# Patient Record
Sex: Male | Born: 1960 | Race: White | Hispanic: Yes | Marital: Married | Smoking: Current some day smoker
Health system: Southern US, Community
[De-identification: ages and names within clinical notes are randomized; demographics above are authoritative.]

## PROBLEM LIST (undated history)

## (undated) DIAGNOSIS — T7840XA Allergy, unspecified, initial encounter: Secondary | ICD-10-CM

## (undated) DIAGNOSIS — K219 Gastro-esophageal reflux disease without esophagitis: Secondary | ICD-10-CM

## (undated) HISTORY — DX: Gastro-esophageal reflux disease without esophagitis: K21.9

## (undated) HISTORY — DX: Allergy, unspecified, initial encounter: T78.40XA

---

## 1990-03-22 HISTORY — PX: CARPAL TUNNEL RELEASE: SHX101

## 2004-10-07 ENCOUNTER — Ambulatory Visit: Payer: Self-pay | Admitting: Family Medicine

## 2005-01-25 ENCOUNTER — Ambulatory Visit: Payer: Self-pay | Admitting: Family Medicine

## 2005-09-07 ENCOUNTER — Ambulatory Visit: Payer: Self-pay | Admitting: Family Medicine

## 2008-03-22 HISTORY — PX: COLONOSCOPY: SHX5424

## 2008-07-23 ENCOUNTER — Ambulatory Visit: Payer: Self-pay | Admitting: Gastroenterology

## 2012-04-10 ENCOUNTER — Ambulatory Visit: Payer: Self-pay | Admitting: Family Medicine

## 2012-04-10 LAB — CBC WITH DIFFERENTIAL/PLATELET
Eosinophil %: 0.8 %
Lymphocyte %: 33.4 %
MCH: 28.6 pg (ref 26.0–34.0)
MCHC: 32.9 g/dL (ref 32.0–36.0)
Monocyte #: 0.6 x10 3/mm (ref 0.2–1.0)
Monocyte %: 6.7 %
Neutrophil #: 5.3 10*3/uL (ref 1.4–6.5)
Neutrophil %: 58.6 %
Platelet: 227 10*3/uL (ref 150–440)
RDW: 12.9 % (ref 11.5–14.5)
WBC: 9.1 10*3/uL (ref 3.8–10.6)

## 2014-05-16 ENCOUNTER — Emergency Department: Payer: Self-pay | Admitting: Internal Medicine

## 2014-07-18 DIAGNOSIS — N132 Hydronephrosis with renal and ureteral calculous obstruction: Secondary | ICD-10-CM | POA: Insufficient documentation

## 2014-07-21 HISTORY — PX: LITHOTRIPSY: SUR834

## 2014-12-10 ENCOUNTER — Encounter: Payer: Self-pay | Admitting: Family Medicine

## 2014-12-10 ENCOUNTER — Ambulatory Visit (INDEPENDENT_AMBULATORY_CARE_PROVIDER_SITE_OTHER): Payer: BLUE CROSS/BLUE SHIELD | Admitting: Family Medicine

## 2014-12-10 VITALS — BP 130/78 | HR 80 | Ht 67.0 in | Wt 206.0 lb

## 2014-12-10 DIAGNOSIS — J0100 Acute maxillary sinusitis, unspecified: Secondary | ICD-10-CM

## 2014-12-10 MED ORDER — AMOXICILLIN-POT CLAVULANATE 875-125 MG PO TABS: 1.0000 | ORAL_TABLET | Freq: Two times a day (BID) | ORAL | Status: DC

## 2014-12-10 NOTE — Progress Notes (Signed)
Name: Blake Phelps   MRN: [id number]    DOB: Mar 01, 1961   Date:12/10/2014       Progress Note  Subjective  Chief Complaint  Chief Complaint  Patient presents with  . Sinusitis    ears feel full and coughing- no production    Sinusitis This is a new problem. The current episode started in the past 7 days. The problem has been gradually worsening since onset. There has been no fever. Associated symptoms include congestion, coughing, a hoarse voice and a sore throat. Pertinent negatives include no chills, diaphoresis, ear pain, headaches, neck pain, shortness of breath or sinus pressure. Past treatments include acetaminophen. The treatment provided moderate relief.    No problem-specific assessment & plan notes found for this encounter.   Past Medical History  Diagnosis Date  . GERD (gastroesophageal reflux disease)   . Allergy     Past Surgical History  Procedure Laterality Date  . Lithotripsy  07/2014  . Carpal tunnel release  1992  . Colonoscopy  2010    cleared for 10 yrs- Dr Bluford Kaufmann    Family History  Problem Relation Age of Onset  . Cancer Mother   . Heart disease Mother   . Cancer Father     Social History   Social History  . Marital Status: Married    Spouse Name: N/A  . Number of Children: N/A  . Years of Education: N/A   Occupational History  . Not on file.   Social History Main Topics  . Smoking status: Current Every Day Smoker  . Smokeless tobacco: Not on file  . Alcohol Use: No  . Drug Use: No  . Sexual Activity: No   Other Topics Concern  . Not on file   Social History Narrative  . No narrative on file    No Known Allergies   Review of Systems  Constitutional: Negative for fever, chills, weight loss, malaise/fatigue and diaphoresis.  HENT: Positive for congestion, hoarse voice and sore throat. Negative for ear discharge, ear pain and sinus pressure.   Eyes: Negative for blurred vision.  Respiratory: Positive for cough. Negative for  sputum production, shortness of breath and wheezing.   Cardiovascular: Negative for chest pain, palpitations and leg swelling.  Gastrointestinal: Negative for heartburn, nausea, abdominal pain, diarrhea, constipation, blood in stool and melena.  Genitourinary: Negative for dysuria, urgency, frequency and hematuria.  Musculoskeletal: Negative for myalgias, back pain, joint pain and neck pain.  Skin: Negative for rash.  Neurological: Negative for dizziness, tingling, sensory change, focal weakness and headaches.  Endo/Heme/Allergies: Negative for environmental allergies and polydipsia. Does not bruise/bleed easily.  Psychiatric/Behavioral: Negative for depression and suicidal ideas. The patient is not nervous/anxious and does not have insomnia.      Objective  Filed Vitals:   12/10/14 1350  BP: 130/78  Pulse: 80  Height:  (1.702 m)  Weight: 206 lb (93.441 kg)    Physical Exam  Constitutional: He is oriented to person, place, and time and well-developed, well-nourished, and in no distress.  HENT:  Head: Normocephalic.  Right Ear: External ear normal.  Left Ear: External ear normal.  Nose: Nose normal.  Mouth/Throat: Oropharynx is clear and moist.  Eyes: Conjunctivae and EOM are normal. Pupils are equal, round, and reactive to light. Right eye exhibits no discharge. Left eye exhibits no discharge. No scleral icterus.  Neck: Normal range of motion. Neck supple. No JVD present. No tracheal deviation present. No thyromegaly present.  Cardiovascular: Normal rate, regular  rhythm, normal heart sounds and intact distal pulses.  Exam reveals no gallop and no friction rub.   No murmur heard. Pulmonary/Chest: Breath sounds normal. No respiratory distress. He has no wheezes. He has no rales. He exhibits no tenderness.  Abdominal: Soft. Bowel sounds are normal. He exhibits no mass. There is no hepatosplenomegaly. There is no tenderness. There is no rebound, no guarding and no CVA tenderness.   Musculoskeletal: Normal range of motion. He exhibits no edema.  Lymphadenopathy:    He has no cervical adenopathy.  Neurological: He is alert and oriented to person, place, and time. He has normal sensation, normal strength and intact cranial nerves. No cranial nerve deficit.  Skin: Skin is warm. No rash noted.  Psychiatric: Mood and affect normal.  Nursing note and vitals reviewed.     Assessment & Plan  Problem List Items Addressed This Visit    None    Visit Diagnoses    Acute maxillary sinusitis, recurrence not specified    -  Primary    Relevant Medications    cetirizine (ZYRTEC) 5 MG tablet    amoxicillin-clavulanate (AUGMENTIN) 875-125 MG per tablet         Dr. [facility]  12/10/2014

## 2015-04-03 ENCOUNTER — Ambulatory Visit (INDEPENDENT_AMBULATORY_CARE_PROVIDER_SITE_OTHER): Payer: Managed Care, Other (non HMO) | Admitting: Family Medicine

## 2015-04-03 ENCOUNTER — Encounter: Payer: Self-pay | Admitting: Family Medicine

## 2015-04-03 VITALS — BP 120/70 | HR 88 | Temp 100.0°F | Ht 67.0 in | Wt 207.0 lb

## 2015-04-03 DIAGNOSIS — B349 Viral infection, unspecified: Secondary | ICD-10-CM | POA: Diagnosis not present

## 2015-04-03 LAB — POCT INFLUENZA A/B: Influenza A, POC: NEGATIVE

## 2015-04-03 MED ORDER — AZITHROMYCIN 250 MG PO TABS: ORAL_TABLET | ORAL | Status: DC

## 2015-04-03 NOTE — Progress Notes (Signed)
Name: Blake HawkingLuis A Phelps   MRN: [id number]    DOB: June 04, 1960   Date:04/03/2015       Progress Note  Subjective  Chief Complaint  Chief Complaint  Patient presents with  . Bronchitis    coughing, joint pains, sweating, vomitting x 1 day    Cough This is a new problem. The current episode started today. The problem has been waxing and waning. The cough is non-productive. Associated symptoms include chills, a fever, headaches, myalgias and sweats. Pertinent negatives include no chest pain, ear congestion, ear pain, heartburn, hemoptysis, nasal congestion, postnasal drip, rash, rhinorrhea, sore throat, shortness of breath, weight loss or wheezing. The treatment provided no relief. There is no history of environmental allergies.    No problem-specific assessment & plan notes found for this encounter.   Past Medical History  Diagnosis Date  . GERD (gastroesophageal reflux disease)   . Allergy     Past Surgical History  Procedure Laterality Date  . Lithotripsy  07/2014  . Carpal tunnel release  1992  . Colonoscopy  2010    cleared for 10 yrs- Dr Bluford Kaufmannh    Family History  Problem Relation Age of Onset  . Cancer Mother   . Heart disease Mother   . Cancer Father     Social History   Social History  . Marital Status: Married    Spouse Name: N/A  . Number of Children: N/A  . Years of Education: N/A   Occupational History  . Not on file.   Social History Main Topics  . Smoking status: Current Every Day Smoker  . Smokeless tobacco: Not on file  . Alcohol Use: No  . Drug Use: No  . Sexual Activity: No   Other Topics Concern  . Not on file   Social History Narrative    No Known Allergies   Review of Systems  Constitutional: Positive for fever, chills, malaise/fatigue and diaphoresis. Negative for weight loss.  HENT: Positive for congestion. Negative for ear discharge, ear pain, postnasal drip, rhinorrhea and sore throat.   Eyes: Negative for blurred vision.   Respiratory: Positive for cough. Negative for hemoptysis, sputum production, shortness of breath and wheezing.   Cardiovascular: Negative for chest pain, palpitations and leg swelling.  Gastrointestinal: Positive for nausea and vomiting. Negative for heartburn, abdominal pain, diarrhea, constipation, blood in stool and melena.  Genitourinary: Negative for dysuria, urgency, frequency and hematuria.  Musculoskeletal: Positive for myalgias and back pain. Negative for joint pain and neck pain.  Skin: Negative for rash.  Neurological: Positive for headaches. Negative for dizziness, tingling, sensory change and focal weakness.  Endo/Heme/Allergies: Negative for environmental allergies and polydipsia. Does not bruise/bleed easily.  Psychiatric/Behavioral: Negative for depression and suicidal ideas. The patient is not nervous/anxious and does not have insomnia.      Objective  Filed Vitals:   04/03/15 1553  BP: 120/70  Pulse: 88  Temp: 100 F (37.8 C)  TempSrc: Oral  Height: 5\' 7"  (1.702 m)  Weight: 207 lb (93.895 kg)    Physical Exam  Constitutional: He is oriented to person, place, and time and well-developed, well-nourished, and in no distress.  HENT:  Head: Normocephalic.  Right Ear: External ear normal.  Left Ear: External ear normal.  Nose: Nose normal.  Mouth/Throat: Oropharynx is clear and moist.  Eyes: Conjunctivae and EOM are normal. Pupils are equal, round, and reactive to light. Right eye exhibits no discharge. Left eye exhibits no discharge. No scleral icterus.  Neck: Normal range  of motion. Neck supple. No JVD present. No tracheal deviation present. No thyromegaly present.  Cardiovascular: Normal rate, regular rhythm, normal heart sounds and intact distal pulses.  Exam reveals no gallop and no friction rub.   No murmur heard. Pulmonary/Chest: Breath sounds normal. No respiratory distress. He has no wheezes. He has no rales.  Abdominal: Soft. Bowel sounds are normal. He  exhibits no mass. There is no hepatosplenomegaly. There is no tenderness. There is no rebound, no guarding and no CVA tenderness.  Musculoskeletal: Normal range of motion. He exhibits no edema or tenderness.  Lymphadenopathy:    He has no cervical adenopathy.  Neurological: He is alert and oriented to person, place, and time. He has normal sensation, normal strength, normal reflexes and intact cranial nerves. No cranial nerve deficit.  Skin: Skin is warm. No rash noted.  Psychiatric: Mood and affect normal.  Nursing note and vitals reviewed.     Assessment & Plan  Problem List Items Addressed This Visit    None    Visit Diagnoses    Viral syndrome    -  Primary    Relevant Medications    azithromycin (ZITHROMAX) 250 MG tablet         Dr. [facility]  04/03/2015

## 2015-12-04 ENCOUNTER — Encounter: Payer: Self-pay | Admitting: Family Medicine

## 2015-12-04 ENCOUNTER — Ambulatory Visit (INDEPENDENT_AMBULATORY_CARE_PROVIDER_SITE_OTHER): Payer: Managed Care, Other (non HMO) | Admitting: Family Medicine

## 2015-12-04 VITALS — BP 120/80 | HR 86 | Temp 98.7°F | Ht 67.0 in | Wt 212.0 lb

## 2015-12-04 DIAGNOSIS — J219 Acute bronchiolitis, unspecified: Secondary | ICD-10-CM | POA: Diagnosis not present

## 2015-12-04 DIAGNOSIS — J0100 Acute maxillary sinusitis, unspecified: Secondary | ICD-10-CM | POA: Diagnosis not present

## 2015-12-04 MED ORDER — GUAIFENESIN-CODEINE 100-10 MG/5ML PO SYRP: 5.0000 mL | ORAL_SOLUTION | Freq: Three times a day (TID) | ORAL | 0 refills | Status: DC | PRN

## 2015-12-04 MED ORDER — AMOXICILLIN-POT CLAVULANATE 875-125 MG PO TABS: 1.0000 | ORAL_TABLET | Freq: Two times a day (BID) | ORAL | 1 refills | Status: DC

## 2015-12-04 NOTE — Progress Notes (Signed)
Name: Blake Phelps   MRN: [id number]    DOB: [birth year]   Date:12/04/2015       Progress Note  Subjective  Chief Complaint  Chief Complaint  Patient presents with  . Sinusitis    Sinusitis  This is a recurrent problem. The current episode started more than 1 year ago. The problem has been gradually worsening since onset. The maximum temperature recorded prior to his arrival was 100.4 - 100.9 F. The fever has been present for less than 1 day. His pain is at a severity of 3/10. The pain is mild. Associated symptoms include congestion, headaches and sinus pressure. Pertinent negatives include no chills, coughing, diaphoresis, ear pain, hoarse voice, neck pain, shortness of breath, sneezing, sore throat or swollen glands. Past treatments include nothing. The treatment provided no relief.    No problem-specific Assessment & Plan notes found for this encounter.   Past Medical History:  Diagnosis Date  . Allergy   . GERD (gastroesophageal reflux disease)     Past Surgical History:  Procedure Laterality Date  . CARPAL TUNNEL RELEASE  1992  . COLONOSCOPY  2010   cleared for 10 yrs- Dr Bluford Kaufmann  . LITHOTRIPSY  07/2014    Family History  Problem Relation Age of Onset  . Cancer Mother   . Heart disease Mother   . Cancer Father     Social History   Social History  . Marital status: Married    Spouse name: N/A  . Number of children: N/A  . Years of education: N/A   Occupational History  . Not on file.   Social History Main Topics  . Smoking status: Current Every Day Smoker  . Smokeless tobacco: Not on file  . Alcohol use No  . Drug use: No  . Sexual activity: No   Other Topics Concern  . Not on file   Social History Narrative  . No narrative on file    No Known Allergies   Review of Systems  Constitutional: Negative for chills, diaphoresis, fever, malaise/fatigue and weight loss.  HENT: Positive for congestion and sinus pressure. Negative for ear discharge, ear  pain, hoarse voice, sneezing and sore throat.   Eyes: Negative for blurred vision.  Respiratory: Negative for cough, sputum production, shortness of breath and wheezing.   Cardiovascular: Negative for chest pain, palpitations and leg swelling.  Gastrointestinal: Negative for abdominal pain, blood in stool, constipation, diarrhea, heartburn, melena and nausea.  Genitourinary: Negative for dysuria, frequency, hematuria and urgency.  Musculoskeletal: Negative for back pain, joint pain, myalgias and neck pain.  Skin: Negative for rash.  Neurological: Positive for headaches. Negative for dizziness, tingling, sensory change and focal weakness.  Endo/Heme/Allergies: Negative for environmental allergies and polydipsia. Does not bruise/bleed easily.  Psychiatric/Behavioral: Negative for depression and suicidal ideas. The patient is not nervous/anxious and does not have insomnia.      Objective  Vitals:   12/04/15 1126  BP: 120/80  Pulse: 86  Temp: 98.7 F (37.1 C)  TempSrc: Temporal  Weight: 212 lb (96.2 kg)  Height: 5\' 7"  (1.702 m)    Physical Exam  Constitutional: He is oriented to person, place, and time and well-developed, well-nourished, and in no distress.  HENT:  Head: Normocephalic.  Right Ear: External ear normal.  Left Ear: External ear normal.  Nose: Nose normal.  Mouth/Throat: Oropharynx is clear and moist.  Eyes: Conjunctivae and EOM are normal. Pupils are equal, round, and reactive to light. Right eye exhibits no discharge. Left  eye exhibits no discharge. No scleral icterus.  Neck: Normal range of motion. Neck supple. No JVD present. No tracheal deviation present. No thyromegaly present.  Cardiovascular: Normal rate, regular rhythm, normal heart sounds and intact distal pulses.  Exam reveals no gallop and no friction rub.   No murmur heard. Pulmonary/Chest: Breath sounds normal. No respiratory distress. He has no wheezes. He has no rales.  Abdominal: Soft. Bowel sounds  are normal. He exhibits no mass. There is no hepatosplenomegaly. There is no tenderness. There is no rebound, no guarding and no CVA tenderness.  Musculoskeletal: Normal range of motion. He exhibits no edema or tenderness.  Lymphadenopathy:    He has no cervical adenopathy.  Neurological: He is alert and oriented to person, place, and time. He has normal sensation, normal strength, normal reflexes and intact cranial nerves. No cranial nerve deficit.  Skin: Skin is warm. No rash noted.  Psychiatric: Mood and affect normal.  Nursing note and vitals reviewed.     Assessment & Plan  Problem List Items Addressed This Visit    None    Visit Diagnoses    Acute maxillary sinusitis, recurrence not specified    -  Primary   Relevant Medications   amoxicillin-clavulanate (AUGMENTIN) 875-125 MG tablet   guaiFENesin-codeine (ROBITUSSIN AC) 100-10 MG/5ML syrup   Bronchiolitis       Relevant Medications   guaiFENesin-codeine (ROBITUSSIN AC) 100-10 MG/5ML syrup        Dr. Hayden Rasmusseneanna Reigan [facility]  12/04/15

## 2016-01-12 ENCOUNTER — Ambulatory Visit
Admission: RE | Admit: 2016-01-12 | Discharge: 2016-01-12 | Disposition: A | Discharge status: Home / Self Care | Payer: Managed Care, Other (non HMO) | Source: Ambulatory Visit | Attending: Family Medicine | Admitting: Family Medicine

## 2016-01-12 ENCOUNTER — Encounter: Payer: Self-pay | Admitting: Family Medicine

## 2016-01-12 ENCOUNTER — Ambulatory Visit (INDEPENDENT_AMBULATORY_CARE_PROVIDER_SITE_OTHER): Payer: Managed Care, Other (non HMO) | Admitting: Family Medicine

## 2016-01-12 VITALS — BP 138/84 | HR 78 | Ht 67.0 in | Wt 203.0 lb

## 2016-01-12 DIAGNOSIS — S99922A Unspecified injury of left foot, initial encounter: Secondary | ICD-10-CM

## 2016-01-12 DIAGNOSIS — S92505A Nondisplaced unspecified fracture of left lesser toe(s), initial encounter for closed fracture: Secondary | ICD-10-CM | POA: Diagnosis not present

## 2016-01-12 DIAGNOSIS — F17210 Nicotine dependence, cigarettes, uncomplicated: Secondary | ICD-10-CM | POA: Diagnosis not present

## 2016-01-12 MED ORDER — VARENICLINE TARTRATE 1 MG PO TABS: 1.0000 mg | ORAL_TABLET | Freq: Two times a day (BID) | ORAL | 1 refills | Status: DC

## 2016-01-12 MED ORDER — TRAMADOL HCL 50 MG PO TABS: 50.0000 mg | ORAL_TABLET | Freq: Three times a day (TID) | ORAL | 0 refills | Status: DC | PRN

## 2016-01-12 NOTE — Progress Notes (Signed)
Name: Blake Phelps   MRN: [id number]    DOB: Jan 25, 1961   Date:01/12/2016       Progress Note  Subjective  Chief Complaint  Chief Complaint  Patient presents with  . Foot Pain    L) foot pain- was jumping a baby gate and hit foot and toes on the gate    Foot Pain  This is a new problem. The current episode started in the past 7 days. The problem occurs daily. The problem has been waxing and waning. Pertinent negatives include no abdominal pain, anorexia, arthralgias, change in bowel habit, chest pain, chills, congestion, coughing, fatigue, fever, headaches, joint swelling, myalgias, nausea, neck pain, rash, sore throat, swollen glands or urinary symptoms. Nothing aggravates the symptoms. He has tried acetaminophen and NSAIDs for the symptoms. The treatment provided no relief.    No problem-specific Assessment & Plan notes found for this encounter.   Past Medical History:  Diagnosis Date  . Allergy   . GERD (gastroesophageal reflux disease)     Past Surgical History:  Procedure Laterality Date  . CARPAL TUNNEL RELEASE  1992  . COLONOSCOPY  2010   cleared for 10 yrs- Dr Bluford Kaufmannh  . LITHOTRIPSY  07/2014    Family History  Problem Relation Age of Onset  . Cancer Mother   . Heart disease Mother   . Cancer Father     Social History   Social History  . Marital status: Married    Spouse name: N/A  . Number of children: N/A  . Years of education: N/A   Occupational History  . Not on file.   Social History Main Topics  . Smoking status: Current Every Day Smoker  . Smokeless tobacco: Not on file  . Alcohol use No  . Drug use: No  . Sexual activity: No   Other Topics Concern  . Not on file   Social History Narrative  . No narrative on file    No Known Allergies   Review of Systems  Constitutional: Negative for chills, fatigue, fever, malaise/fatigue and weight loss.  HENT: Negative for congestion, ear discharge, ear pain and sore throat.   Eyes: Negative for  blurred vision.  Respiratory: Negative for cough, sputum production, shortness of breath and wheezing.   Cardiovascular: Negative for chest pain, palpitations and leg swelling.  Gastrointestinal: Negative for abdominal pain, anorexia, blood in stool, change in bowel habit, constipation, diarrhea, heartburn, melena and nausea.  Genitourinary: Negative for dysuria, frequency, hematuria and urgency.  Musculoskeletal: Negative for arthralgias, back pain, joint pain, joint swelling, myalgias and neck pain.  Skin: Negative for rash.  Neurological: Negative for dizziness, tingling, sensory change, focal weakness and headaches.  Endo/Heme/Allergies: Negative for environmental allergies and polydipsia. Does not bruise/bleed easily.  Psychiatric/Behavioral: Negative for depression and suicidal ideas. The patient is not nervous/anxious and does not have insomnia.      Objective  Vitals:   01/12/16 0929  BP: 138/84  Pulse: 78  Weight: 203 lb (92.1 kg)  Height: 5\' 7"  (1.702 m)    Physical Exam  Constitutional: He is oriented to person, place, and time and well-developed, well-nourished, and in no distress.  HENT:  Head: Normocephalic.  Right Ear: External ear normal.  Left Ear: External ear normal.  Nose: Nose normal.  Mouth/Throat: Oropharynx is clear and moist.  Eyes: Conjunctivae and EOM are normal. Pupils are equal, round, and reactive to light. Right eye exhibits no discharge. Left eye exhibits no discharge. No scleral icterus.  Neck:  Normal range of motion. Neck supple. No JVD present. No tracheal deviation present. No thyromegaly present.  Cardiovascular: Normal rate, regular rhythm, normal heart sounds and intact distal pulses.  Exam reveals no gallop and no friction rub.   No murmur heard. Pulmonary/Chest: Breath sounds normal. No respiratory distress. He has no wheezes. He has no rales.  Abdominal: Soft. Bowel sounds are normal. He exhibits no mass. There is no hepatosplenomegaly.  There is no tenderness. There is no rebound, no guarding and no CVA tenderness.  Musculoskeletal: Normal range of motion. He exhibits no edema.       Left foot: There is tenderness and bony tenderness. There is normal range of motion, no swelling, normal capillary refill, no crepitus, no deformity and no laceration.  Lymphadenopathy:    He has no cervical adenopathy.  Neurological: He is alert and oriented to person, place, and time. He has normal sensation, normal strength and intact cranial nerves. No cranial nerve deficit.  Skin: Skin is warm. No rash noted.  Psychiatric: Mood and affect normal.  Nursing note and vitals reviewed.     Assessment & Plan  Problem List Items Addressed This Visit    None    Visit Diagnoses    Foot trauma, left, initial encounter    -  Primary   Relevant Medications   traMADol (ULTRAM) 50 MG tablet   Other Relevant Orders   DG Foot Complete Left (Completed)   Cigarette nicotine dependence without complication       Relevant Medications   varenicline (CHANTIX CONTINUING MONTH PAK) 1 MG tablet        Dr. [facility]  01/12/16

## 2016-03-05 ENCOUNTER — Encounter: Payer: Self-pay | Admitting: Family Medicine

## 2016-03-05 ENCOUNTER — Ambulatory Visit (INDEPENDENT_AMBULATORY_CARE_PROVIDER_SITE_OTHER): Payer: Managed Care, Other (non HMO) | Admitting: Family Medicine

## 2016-03-05 VITALS — BP 124/80 | HR 68 | Ht 67.0 in | Wt 207.0 lb

## 2016-03-05 DIAGNOSIS — M25511 Pain in right shoulder: Secondary | ICD-10-CM

## 2016-03-05 MED ORDER — HYDROCODONE-ACETAMINOPHEN 5-325 MG PO TABS: 1.0000 | ORAL_TABLET | Freq: Three times a day (TID) | ORAL | 0 refills | Status: DC

## 2016-03-05 MED ORDER — MELOXICAM 15 MG PO TABS: 15.0000 mg | ORAL_TABLET | Freq: Every day | ORAL | 0 refills | Status: DC

## 2016-03-05 NOTE — Progress Notes (Signed)
Name: Blake Phelps   MRN: [id number]    DOB: [birth year]   Date:03/05/2016       Progress Note  Subjective  Chief Complaint  Chief Complaint  Patient presents with  . Shoulder Pain    R) shoulder pain- pulled wife up in the bed and pulled something- hurts really bad by the end of the day    Shoulder Pain   The pain is present in the right shoulder. This is a new problem. The current episode started 1 to 4 weeks ago. There has been a history of trauma (lifting wife). The problem occurs daily. The problem has been gradually worsening. The quality of the pain is described as aching. The pain is at a severity of 5/10. The pain is moderate. Associated symptoms include an inability to bear weight, a limited range of motion and tingling. Pertinent negatives include no fever, joint locking or joint swelling. He has tried NSAIDS for the symptoms. The treatment provided mild relief.    No problem-specific Assessment & Plan notes found for this encounter.   Past Medical History:  Diagnosis Date  . Allergy   . GERD (gastroesophageal reflux disease)     Past Surgical History:  Procedure Laterality Date  . CARPAL TUNNEL RELEASE  1992  . COLONOSCOPY  2010   cleared for 10 yrs- Dr Bluford Kaufmannh  . LITHOTRIPSY  07/2014    Family History  Problem Relation Age of Onset  . Cancer Mother   . Heart disease Mother   . Cancer Father     Social History   Social History  . Marital status: Married    Spouse name: N/A  . Number of children: N/A  . Years of education: N/A   Occupational History  . Not on file.   Social History Main Topics  . Smoking status: Current Every Day Smoker  . Smokeless tobacco: Not on file  . Alcohol use No  . Drug use: No  . Sexual activity: No   Other Topics Concern  . Not on file   Social History Narrative  . No narrative on file    No Known Allergies   Review of Systems  Constitutional: Negative for chills, fever, malaise/fatigue and weight loss.  HENT:  Negative for ear discharge, ear pain and sore throat.   Eyes: Negative for blurred vision.  Respiratory: Negative for cough, sputum production, shortness of breath and wheezing.   Cardiovascular: Negative for chest pain, palpitations and leg swelling.  Gastrointestinal: Negative for abdominal pain, blood in stool, constipation, diarrhea, heartburn, melena and nausea.  Genitourinary: Negative for dysuria, frequency, hematuria and urgency.  Musculoskeletal: Negative for back pain, joint pain, myalgias and neck pain.  Skin: Negative for rash.  Neurological: Positive for tingling. Negative for dizziness, sensory change, focal weakness and headaches.  Endo/Heme/Allergies: Negative for environmental allergies and polydipsia. Does not bruise/bleed easily.  Psychiatric/Behavioral: Negative for depression and suicidal ideas. The patient is not nervous/anxious and does not have insomnia.      Objective  Vitals:   03/05/16 1346  BP: 124/80  Pulse: 68  Weight: 207 lb (93.9 kg)  Height: 5\' 7"  (1.702 m)    Physical Exam  Constitutional: He is oriented to person, place, and time and well-developed, well-nourished, and in no distress.  HENT:  Head: Normocephalic.  Right Ear: External ear normal.  Left Ear: External ear normal.  Nose: Nose normal.  Mouth/Throat: Oropharynx is clear and moist.  Eyes: Conjunctivae and EOM are normal. Pupils are equal,  round, and reactive to light. Right eye exhibits no discharge. Left eye exhibits no discharge. No scleral icterus.  Neck: Normal range of motion. Neck supple. No JVD present. No tracheal deviation present. No thyromegaly present.  Cardiovascular: Normal rate, regular rhythm, normal heart sounds and intact distal pulses.  Exam reveals no gallop and no friction rub.   No murmur heard. Pulmonary/Chest: Breath sounds normal. No respiratory distress. He has no wheezes. He has no rales.  Abdominal: Soft. Bowel sounds are normal. He exhibits no mass. There  is no hepatosplenomegaly. There is no tenderness. There is no rebound, no guarding and no CVA tenderness.  Musculoskeletal: He exhibits no edema.       Right shoulder: He exhibits tenderness. He exhibits no swelling.  Tenderness anterior aspect  Lymphadenopathy:    He has no cervical adenopathy.  Neurological: He is alert and oriented to person, place, and time. He has normal sensation, normal strength and intact cranial nerves. No cranial nerve deficit.  Skin: Skin is warm. No rash noted.  Psychiatric: Mood and affect normal.  Nursing note reviewed.     Assessment & Plan  Problem List Items Addressed This Visit    None    Visit Diagnoses    Pain in joint of right shoulder    -  Primary   Relevant Medications   meloxicam (MOBIC) 15 MG tablet   HYDROcodone-acetaminophen (NORCO/VICODIN) 5-325 MG tablet        Dr. Hayden Rasmusseneanna [facility]ue [facility]  03/05/16

## 2016-06-22 ENCOUNTER — Encounter: Payer: Self-pay | Admitting: Family Medicine

## 2016-06-22 ENCOUNTER — Ambulatory Visit (INDEPENDENT_AMBULATORY_CARE_PROVIDER_SITE_OTHER): Payer: Managed Care, Other (non HMO) | Admitting: Family Medicine

## 2016-06-22 VITALS — BP 130/100 | HR 80 | Ht 67.0 in | Wt 207.0 lb

## 2016-06-22 DIAGNOSIS — J40 Bronchitis, not specified as acute or chronic: Secondary | ICD-10-CM | POA: Diagnosis not present

## 2016-06-22 DIAGNOSIS — J0100 Acute maxillary sinusitis, unspecified: Secondary | ICD-10-CM | POA: Diagnosis not present

## 2016-06-22 MED ORDER — AMOXICILLIN-POT CLAVULANATE 875-125 MG PO TABS: 1.0000 | ORAL_TABLET | Freq: Two times a day (BID) | ORAL | 0 refills | Status: DC

## 2016-06-22 MED ORDER — GUAIFENESIN-CODEINE 100-10 MG/5ML PO SYRP: 5.0000 mL | ORAL_SOLUTION | Freq: Three times a day (TID) | ORAL | 0 refills | Status: DC | PRN

## 2016-06-22 MED ORDER — FLUTICASONE PROPIONATE 50 MCG/ACT NA SUSP: 2.0000 | Freq: Every day | NASAL | 6 refills | Status: DC

## 2016-06-22 NOTE — Progress Notes (Signed)
Name: Blake Phelps   MRN: [id number]    DOB: [birth year]   Date:06/22/2016       Progress Note  Subjective  Chief Complaint  Chief Complaint  Patient presents with  . Allergic Rhinitis     stopped Zyrtec    Cough  This is a new problem. The current episode started 1 to 4 weeks ago. The problem has been waxing and waning. The cough is non-productive. Associated symptoms include headaches. Pertinent negatives include no chest pain, chills, ear congestion, ear pain, fever, heartburn, hemoptysis, myalgias, nasal congestion, postnasal drip, rash, rhinorrhea, sore throat, shortness of breath, sweats, weight loss or wheezing. Nothing aggravates the symptoms. He has tried nothing (vegan nasal spray from [place]) for the symptoms. His past medical history is significant for bronchitis. There is no history of asthma, bronchiectasis, COPD, emphysema, environmental allergies or pneumonia.  Sinusitis  This is a chronic problem. The current episode started 1 to 4 weeks ago. The problem has been gradually worsening since onset. There has been no fever. The pain is mild. Associated symptoms include congestion, coughing, headaches, a hoarse voice, sinus pressure and sneezing. Pertinent negatives include no chills, diaphoresis, ear pain, neck pain, shortness of breath, sore throat or swollen glands.    No problem-specific Assessment & Plan notes found for this encounter.   Past Medical History:  Diagnosis Date  . Allergy   . GERD (gastroesophageal reflux disease)     Past Surgical History:  Procedure Laterality Date  . CARPAL TUNNEL RELEASE  1992  . COLONOSCOPY  2010   cleared for 10 yrs- Dr Bluford Kaufmann  . LITHOTRIPSY  07/2014    Family History  Problem Relation Age of Onset  . Cancer Mother   . Heart disease Mother   . Cancer Father     Social History   Social History  . Marital status: Married    Spouse name: N/A  . Number of children: N/A  . Years of education: N/A   Occupational History   . Not on file.   Social History Main Topics  . Smoking status: Current Some Day Smoker    Types: Cigarettes  . Smokeless tobacco: Not on file  . Alcohol use No  . Drug use: No  . Sexual activity: No   Other Topics Concern  . Not on file   Social History Narrative  . No narrative on file    No Known Allergies  Outpatient Medications Prior to Visit  Medication Sig Dispense Refill  . aspirin 81 MG EC tablet Take 81 mg by mouth daily.    . ranitidine (ZANTAC) 150 MG tablet Take 300 mg by mouth daily.    . cetirizine (ZYRTEC) 5 MG tablet Take 5 mg by mouth daily.    Marland Kitchen HYDROcodone-acetaminophen (NORCO/VICODIN) 5-325 MG tablet Take 1 tablet by mouth 3 (three) times daily. 30 tablet 0  . meloxicam (MOBIC) 15 MG tablet Take 1 tablet (15 mg total) by mouth daily. 30 tablet 0  . varenicline (CHANTIX CONTINUING MONTH PAK) 1 MG tablet Take 1 tablet (1 mg total) by mouth 2 (two) times daily. 180 tablet 1   No facility-administered medications prior to visit.     Review of Systems  Constitutional: Negative for chills, diaphoresis, fever, malaise/fatigue and weight loss.  HENT: Positive for congestion, hoarse voice, sinus pressure and sneezing. Negative for ear discharge, ear pain, postnasal drip, rhinorrhea and sore throat.   Eyes: Negative for blurred vision.  Respiratory: Positive for cough. Negative for hemoptysis,  sputum production, shortness of breath and wheezing.   Cardiovascular: Negative for chest pain, palpitations and leg swelling.  Gastrointestinal: Negative for abdominal pain, blood in stool, constipation, diarrhea, heartburn, melena and nausea.  Genitourinary: Negative for dysuria, frequency, hematuria and urgency.  Musculoskeletal: Negative for back pain, joint pain, myalgias and neck pain.  Skin: Negative for rash.  Neurological: Positive for headaches. Negative for dizziness, tingling, sensory change and focal weakness.  Endo/Heme/Allergies: Negative for environmental  allergies and polydipsia. Does not bruise/bleed easily.  Psychiatric/Behavioral: Negative for depression and suicidal ideas. The patient is not nervous/anxious and does not have insomnia.      Objective  Vitals:   06/22/16 0826  BP: (!) 130/100  Pulse: 80  Weight: 207 lb (93.9 kg)  Height:  (1.702 m)    Physical Exam  Constitutional: He is oriented to person, place, and time and well-developed, well-nourished, and in no distress.  HENT:  Head: Normocephalic.  Right Ear: External ear normal.  Left Ear: External ear normal.  Nose: Nose normal.  Mouth/Throat: Oropharynx is clear and moist.  Eyes: Conjunctivae and EOM are normal. Pupils are equal, round, and reactive to light. Right eye exhibits no discharge. Left eye exhibits no discharge. No scleral icterus.  Neck: Normal range of motion. Neck supple. No JVD present. No tracheal deviation present. No thyromegaly present.  Cardiovascular: Normal rate, regular rhythm, normal heart sounds and intact distal pulses.  Exam reveals no gallop and no friction rub.   No murmur heard. Pulmonary/Chest: Breath sounds normal. No respiratory distress. He has no wheezes. He has no rales.  Abdominal: Soft. Bowel sounds are normal. He exhibits no mass. There is no hepatosplenomegaly. There is no tenderness. There is no rebound, no guarding and no CVA tenderness.  Musculoskeletal: Normal range of motion. He exhibits no edema or tenderness.  Lymphadenopathy:    He has no cervical adenopathy.  Neurological: He is alert and oriented to person, place, and time. He has normal sensation, normal strength, normal reflexes and intact cranial nerves. No cranial nerve deficit.  Skin: Skin is warm. No rash noted.  Psychiatric: Mood and affect normal.  Nursing note and vitals reviewed.     Assessment & Plan  Problem List Items Addressed This Visit    None    Visit Diagnoses    Acute maxillary sinusitis, recurrence not specified    -  Primary    allegra d   Relevant Medications   guaiFENesin-codeine (ROBITUSSIN AC) 100-10 MG/5ML syrup   fluticasone (FLONASE) 50 MCG/ACT nasal spray   amoxicillin-clavulanate (AUGMENTIN) 875-125 MG tablet   Bronchitis       Relevant Medications   guaiFENesin-codeine (ROBITUSSIN AC) 100-10 MG/5ML syrup   fluticasone (FLONASE) 50 MCG/ACT nasal spray   amoxicillin-clavulanate (AUGMENTIN) 875-125 MG tablet      Meds ordered this encounter  Medications  . DISCONTD: amoxicillin-clavulanate (AUGMENTIN) 875-125 MG tablet    Sig: Take 1 tablet by mouth 2 (two) times daily.    Dispense:  20 tablet    Refill:  0  . guaiFENesin-codeine (ROBITUSSIN AC) 100-10 MG/5ML syrup    Sig: Take 5 mLs by mouth 3 (three) times daily as needed for cough.    Dispense:  150 mL    Refill:  0  . DISCONTD: fluticasone (FLONASE) 50 MCG/ACT nasal spray    Sig: Place 2 sprays into both nostrils daily.    Dispense:  16 g    Refill:  6  . fluticasone (FLONASE) 50 MCG/ACT nasal spray  Sig: Place 2 sprays into both nostrils daily.    Dispense:  16 g    Refill:  6  . amoxicillin-clavulanate (AUGMENTIN) 875-125 MG tablet    Sig: Take 1 tablet by mouth 2 (two) times daily.    Dispense:  20 tablet    Refill:  0      Dr. [facility]  06/22/16

## 2016-08-31 ENCOUNTER — Ambulatory Visit (INDEPENDENT_AMBULATORY_CARE_PROVIDER_SITE_OTHER): Payer: Managed Care, Other (non HMO) | Admitting: Family Medicine

## 2016-08-31 VITALS — BP 130/98 | HR 70 | Ht 67.0 in | Wt 206.0 lb

## 2016-08-31 DIAGNOSIS — F4320 Adjustment disorder, unspecified: Secondary | ICD-10-CM

## 2016-08-31 DIAGNOSIS — F321 Major depressive disorder, single episode, moderate: Secondary | ICD-10-CM

## 2016-08-31 DIAGNOSIS — F419 Anxiety disorder, unspecified: Secondary | ICD-10-CM

## 2016-08-31 DIAGNOSIS — F4321 Adjustment disorder with depressed mood: Secondary | ICD-10-CM

## 2016-08-31 MED ORDER — ALPRAZOLAM 0.25 MG PO TABS: 0.2500 mg | ORAL_TABLET | Freq: Two times a day (BID) | ORAL | 0 refills | Status: DC | PRN

## 2016-08-31 MED ORDER — SERTRALINE HCL 50 MG PO TABS
50.0000 mg | ORAL_TABLET | Freq: Every day | ORAL | 3 refills | Status: DC
Start: 2016-08-31 — End: 2017-04-22

## 2016-08-31 NOTE — Addendum Note (Signed)
Addended by: Everitt AmberLYNCH, TARA L on: 08/31/2016 03:10 PM   Modules accepted: Orders

## 2016-08-31 NOTE — Progress Notes (Signed)
Name: Blake Phelps   MRN: [id number]    DOB: Feb 22, 1961   Date:08/31/2016       Progress Note  Subjective  Chief Complaint  Chief Complaint  Patient presents with  . Depression    wife passed away 4 months ago    Depression         The patient presents with no depression.  This is a new problem.  The current episode started more than 1 year ago.   The onset quality is sudden.   The problem occurs daily.  The problem has been gradually worsening since onset.  Associated symptoms include decreased concentration, helplessness, insomnia, irritable, decreased interest, appetite change, sad and suicidal ideas.  Associated symptoms include no hopelessness, no myalgias and no headaches.     The symptoms are aggravated by family issues (recent loss of spouse).  Past treatments include nothing.  Risk factors include major life event.    Pertinent negatives include no chronic illness, no recent illness, no depression and no suicide attempts.   No problem-specific Assessment & Plan notes found for this encounter.   Past Medical History:  Diagnosis Date  . Allergy   . GERD (gastroesophageal reflux disease)     Past Surgical History:  Procedure Laterality Date  . CARPAL TUNNEL RELEASE  1992  . COLONOSCOPY  2010   cleared for 10 yrs- Dr Bluford Kaufmannh  . LITHOTRIPSY  07/2014    Family History  Problem Relation Age of Onset  . Cancer Mother   . Heart disease Mother   . Cancer Father     Social History   Social History  . Marital status: Married    Spouse name: N/A  . Number of children: N/A  . Years of education: N/A   Occupational History  . Not on file.   Social History Main Topics  . Smoking status: Current Some Day Smoker    Types: Cigarettes  . Smokeless tobacco: Not on file  . Alcohol use No  . Drug use: No  . Sexual activity: No   Other Topics Concern  . Not on file   Social History Narrative  . No narrative on file    No Known Allergies  Outpatient Medications Prior  to Visit  Medication Sig Dispense Refill  . cetirizine (ZYRTEC) 5 MG tablet Take 5 mg by mouth daily.    . fluticasone (FLONASE) 50 MCG/ACT nasal spray Place 2 sprays into both nostrils daily. 16 g 6  . ranitidine (ZANTAC) 150 MG tablet Take 300 mg by mouth daily.    Marland Kitchen. aspirin 81 MG EC tablet Take 81 mg by mouth daily.    Marland Kitchen. amoxicillin-clavulanate (AUGMENTIN) 875-125 MG tablet Take 1 tablet by mouth 2 (two) times daily. 20 tablet 0  . guaiFENesin-codeine (ROBITUSSIN AC) 100-10 MG/5ML syrup Take 5 mLs by mouth 3 (three) times daily as needed for cough. 150 mL 0   No facility-administered medications prior to visit.     Review of Systems  Constitutional: Positive for appetite change. Negative for chills, fever, malaise/fatigue and weight loss.  HENT: Negative for ear discharge, ear pain and sore throat.   Eyes: Negative for blurred vision.  Respiratory: Negative for cough, sputum production, shortness of breath and wheezing.   Cardiovascular: Negative for chest pain, palpitations and leg swelling.  Gastrointestinal: Negative for abdominal pain, blood in stool, constipation, diarrhea, heartburn, melena and nausea.  Genitourinary: Negative for dysuria, frequency, hematuria and urgency.  Musculoskeletal: Negative for back pain, joint pain, myalgias  and neck pain.  Skin: Negative for rash.  Neurological: Negative for dizziness, tingling, sensory change, focal weakness and headaches.  Endo/Heme/Allergies: Negative for environmental allergies and polydipsia. Does not bruise/bleed easily.  Psychiatric/Behavioral: Positive for decreased concentration, depression and suicidal ideas. The patient has insomnia. The patient is not nervous/anxious.      Objective  Vitals:   08/31/16 1201  BP: (!) 130/98  Pulse: 70  Weight: 206 lb (93.4 kg)  Height: 5\' 7"  (1.702 m)    Physical Exam  Constitutional: He is oriented to person, place, and time and well-developed, well-nourished, and in no distress.  He is irritable.  HENT:  Head: Normocephalic.  Right Ear: External ear normal.  Left Ear: External ear normal.  Nose: Nose normal.  Mouth/Throat: Oropharynx is clear and moist.  Eyes: Conjunctivae and EOM are normal. Pupils are equal, round, and reactive to light. Right eye exhibits no discharge. Left eye exhibits no discharge. No scleral icterus.  Neck: Normal range of motion. Neck supple. No JVD present. No tracheal deviation present. No thyromegaly present.  Cardiovascular: Normal rate, regular rhythm, normal heart sounds and intact distal pulses.  Exam reveals no gallop and no friction rub.   No murmur heard. Pulmonary/Chest: Breath sounds normal. No respiratory distress. He has no wheezes. He has no rales.  Abdominal: Soft. Bowel sounds are normal. He exhibits no mass. There is no hepatosplenomegaly. There is no tenderness. There is no rebound, no guarding and no CVA tenderness.  Musculoskeletal: Normal range of motion. He exhibits no edema or tenderness.  Lymphadenopathy:    He has no cervical adenopathy.  Neurological: He is alert and oriented to person, place, and time. He has normal sensation, normal strength, normal reflexes and intact cranial nerves. No cranial nerve deficit.  Skin: Skin is warm. No rash noted.  Psychiatric: Mood and affect normal.  Nursing note and vitals reviewed.     Assessment & Plan  Problem List Items Addressed This Visit    None    Visit Diagnoses    Current moderate episode of major depressive disorder without prior episode (HCC)    -  Primary   Relevant Medications   sertraline (ZOLOFT) 50 MG tablet   ALPRAZolam (XANAX) 0.25 MG tablet   Grief reaction       Anxiety       Relevant Medications   sertraline (ZOLOFT) 50 MG tablet   ALPRAZolam (XANAX) 0.25 MG tablet      Meds ordered this encounter  Medications  . sertraline (ZOLOFT) 50 MG tablet    Sig: Take 1 tablet (50 mg total) by mouth daily.    Dispense:  30 tablet    Refill:  3     One half tablet a day for 8 days then 1 a day  . ALPRAZolam (XANAX) 0.25 MG tablet    Sig: Take 1 tablet (0.25 mg total) by mouth 2 (two) times daily as needed for anxiety.    Dispense:  20 tablet    Refill:  0    At hs for anxiety prior to sleep      Dr. [facility]  08/31/16

## 2016-09-02 ENCOUNTER — Other Ambulatory Visit: Payer: Self-pay

## 2016-12-13 ENCOUNTER — Ambulatory Visit (INDEPENDENT_AMBULATORY_CARE_PROVIDER_SITE_OTHER): Payer: Managed Care, Other (non HMO) | Admitting: Family Medicine

## 2016-12-13 ENCOUNTER — Encounter: Payer: Self-pay | Admitting: Family Medicine

## 2016-12-13 VITALS — BP 120/84 | HR 68 | Ht 67.0 in | Wt 210.0 lb

## 2016-12-13 DIAGNOSIS — J0100 Acute maxillary sinusitis, unspecified: Secondary | ICD-10-CM

## 2016-12-13 DIAGNOSIS — J40 Bronchitis, not specified as acute or chronic: Secondary | ICD-10-CM | POA: Diagnosis not present

## 2016-12-13 MED ORDER — AMOXICILLIN-POT CLAVULANATE 875-125 MG PO TABS: 1.0000 | ORAL_TABLET | Freq: Two times a day (BID) | ORAL | 0 refills | Status: DC

## 2016-12-13 MED ORDER — GUAIFENESIN-CODEINE 100-10 MG/5ML PO SYRP: 5.0000 mL | ORAL_SOLUTION | Freq: Three times a day (TID) | ORAL | 0 refills | Status: DC | PRN

## 2016-12-13 NOTE — Progress Notes (Signed)
Name: Blake Phelps   MRN: [id number]    DOB: [birth year]   Date:12/13/2016       Progress Note  Subjective  Chief Complaint  Chief Complaint  Patient presents with  . Sinusitis    tonsils are still swollen and has a cough- had Augmentin 500/125mg  BID x 5 days starting on 9/9    Sinusitis  This is a recurrent problem. The current episode started 1 to 4 weeks ago. The problem has been waxing and waning since onset. There has been no fever. The pain is mild. Associated symptoms include congestion, coughing, sinus pressure and swollen glands. Pertinent negatives include no chills, diaphoresis, ear pain, headaches, hoarse voice, neck pain, shortness of breath, sneezing or sore throat. Past treatments include antibiotics. The treatment provided moderate relief.  Cough  This is a recurrent problem. The current episode started 1 to 4 weeks ago. The problem has been waxing and waning. The cough is productive of purulent sputum (green). Associated symptoms include rhinorrhea. Pertinent negatives include no chest pain, chills, ear pain, fever, headaches, heartburn, hemoptysis, myalgias, rash, sore throat, shortness of breath, weight loss or wheezing. There is no history of environmental allergies.    No problem-specific Assessment & Plan notes found for this encounter.   Past Medical History:  Diagnosis Date  . Allergy   . GERD (gastroesophageal reflux disease)     Past Surgical History:  Procedure Laterality Date  . CARPAL TUNNEL RELEASE  1992  . COLONOSCOPY  2010   cleared for 10 yrs- Dr Bluford Kaufmann  . LITHOTRIPSY  07/2014    Family History  Problem Relation Age of Onset  . Cancer Mother   . Heart disease Mother   . Cancer Father     Social History   Social History  . Marital status: Married    Spouse name: N/A  . Number of children: N/A  . Years of education: N/A   Occupational History  . Not on file.   Social History Main Topics  . Smoking status: Current Some Day Smoker   Types: Cigarettes  . Smokeless tobacco: Never Used  . Alcohol use No  . Drug use: No  . Sexual activity: No   Other Topics Concern  . Not on file   Social History Narrative  . No narrative on file    No Known Allergies  Outpatient Medications Prior to Visit  Medication Sig Dispense Refill  . aspirin 81 MG EC tablet Take 81 mg by mouth daily.    . cetirizine (ZYRTEC) 5 MG tablet Take 5 mg by mouth daily.    . ranitidine (ZANTAC) 150 MG tablet Take 300 mg by mouth daily.    . sertraline (ZOLOFT) 50 MG tablet Take 1 tablet (50 mg total) by mouth daily. (Patient not taking: Reported on 12/13/2016) 30 tablet 3  . ALPRAZolam (XANAX) 0.25 MG tablet Take 1 tablet (0.25 mg total) by mouth 2 (two) times daily as needed for anxiety. 20 tablet 0  . fluticasone (FLONASE) 50 MCG/ACT nasal spray Place 2 sprays into both nostrils daily. 16 g 6   No facility-administered medications prior to visit.     Review of Systems  Constitutional: Negative for chills, diaphoresis, fever, malaise/fatigue and weight loss.  HENT: Positive for congestion, rhinorrhea and sinus pressure. Negative for ear discharge, ear pain, hoarse voice, sneezing and sore throat.   Eyes: Negative for blurred vision.  Respiratory: Positive for cough. Negative for hemoptysis, sputum production, shortness of breath and wheezing.  Cardiovascular: Negative for chest pain, palpitations and leg swelling.  Gastrointestinal: Negative for abdominal pain, blood in stool, constipation, diarrhea, heartburn, melena and nausea.  Genitourinary: Negative for dysuria, frequency, hematuria and urgency.  Musculoskeletal: Negative for back pain, joint pain, myalgias and neck pain.  Skin: Negative for rash.  Neurological: Negative for dizziness, tingling, sensory change, focal weakness and headaches.  Endo/Heme/Allergies: Negative for environmental allergies and polydipsia. Does not bruise/bleed easily.  Psychiatric/Behavioral: Negative for  depression and suicidal ideas. The patient is not nervous/anxious and does not have insomnia.      Objective  Vitals:   12/13/16 1503  BP: 120/84  Pulse: 68  Weight: 210 lb (95.3 kg)  Height:  (1.702 m)    Physical Exam  Constitutional: He is oriented to person, place, and time and well-developed, well-nourished, and in no distress.  HENT:  Head: Normocephalic.  Right Ear: External ear normal.  Left Ear: External ear normal.  Nose: Nose normal.  Mouth/Throat: Oropharynx is clear and moist.  Eyes: Pupils are equal, round, and reactive to light. Conjunctivae and EOM are normal. Right eye exhibits no discharge. Left eye exhibits no discharge. No scleral icterus.  Neck: Normal range of motion. Neck supple. No JVD present. No tracheal deviation present. No thyromegaly present.  Cardiovascular: Normal rate, regular rhythm, normal heart sounds and intact distal pulses.  Exam reveals no gallop and no friction rub.   No murmur heard. Pulmonary/Chest: Breath sounds normal. No respiratory distress. He has no wheezes. He has no rales.  Abdominal: Soft. Bowel sounds are normal. He exhibits no mass. There is no hepatosplenomegaly. There is no tenderness. There is no rebound, no guarding and no CVA tenderness.  Musculoskeletal: Normal range of motion. He exhibits no edema or tenderness.  Lymphadenopathy:    He has no cervical adenopathy.  Neurological: He is alert and oriented to person, place, and time. He has normal sensation, normal strength, normal reflexes and intact cranial nerves. No cranial nerve deficit.  Skin: Skin is warm. No rash noted.  Psychiatric: Mood and affect normal.  Nursing note and vitals reviewed.     Assessment & Plan  Problem List Items Addressed This Visit    None    Visit Diagnoses    Bronchitis    -  Primary   Relevant Medications   amoxicillin-clavulanate (AUGMENTIN) 875-125 MG tablet   guaiFENesin-codeine (ROBITUSSIN AC) 100-10 MG/5ML syrup    Subacute maxillary sinusitis       partially treated   Relevant Medications   amoxicillin-clavulanate (AUGMENTIN) 875-125 MG tablet   guaiFENesin-codeine (ROBITUSSIN AC) 100-10 MG/5ML syrup      Meds ordered this encounter  Medications  . amoxicillin-clavulanate (AUGMENTIN) 875-125 MG tablet    Sig: Take 1 tablet by mouth 2 (two) times daily.    Dispense:  20 tablet    Refill:  0  . guaiFENesin-codeine (ROBITUSSIN AC) 100-10 MG/5ML syrup    Sig: Take 5 mLs by mouth 3 (three) times daily as needed for cough.    Dispense:  150 mL    Refill:  0      Dr. [facility]  12/13/16

## 2017-04-22 ENCOUNTER — Ambulatory Visit (INDEPENDENT_AMBULATORY_CARE_PROVIDER_SITE_OTHER): Payer: No Typology Code available for payment source | Admitting: Family Medicine

## 2017-04-22 ENCOUNTER — Encounter: Payer: Self-pay | Admitting: Family Medicine

## 2017-04-22 VITALS — BP 140/100 | HR 76 | Ht 67.0 in | Wt 211.0 lb

## 2017-04-22 DIAGNOSIS — N410 Acute prostatitis: Secondary | ICD-10-CM

## 2017-04-22 LAB — POCT URINALYSIS DIPSTICK
BILIRUBIN UA: NEGATIVE
GLUCOSE UA: NEGATIVE
KETONES UA: NEGATIVE
Leukocytes, UA: NEGATIVE
Nitrite, UA: NEGATIVE
PH UA: 5 (ref 5.0–8.0)
Protein, UA: NEGATIVE
UROBILINOGEN UA: 0.2 U/dL

## 2017-04-22 MED ORDER — DOXYCYCLINE HYCLATE 100 MG PO TABS: 100.0000 mg | ORAL_TABLET | Freq: Two times a day (BID) | ORAL | 0 refills | Status: DC

## 2017-04-22 NOTE — Progress Notes (Signed)
[place]   Name: Blake Phelps   MRN: [id number]    DOB: [birth year]   Date:04/22/2017       Progress Note  Subjective  Chief Complaint  Chief Complaint  Patient presents with  . Urinary Frequency    nocturia and sweating at night    Urinary Frequency   This is a new problem. The current episode started 1 to 4 weeks ago (2 weeks). The problem occurs intermittently. The problem has been waxing and waning. The quality of the pain is described as burning. The pain is mild. There has been no fever. He is not sexually active. There is no history of pyelonephritis. Associated symptoms include flank pain and frequency. Pertinent negatives include no chills, discharge, hematuria, hesitancy, nausea, sweats, urgency or vomiting. He has tried acetaminophen for the symptoms. The treatment provided mild relief.    No problem-specific Assessment & Plan notes found for this encounter.   Past Medical History:  Diagnosis Date  . Allergy   . GERD (gastroesophageal reflux disease)     Past Surgical History:  Procedure Laterality Date  . CARPAL TUNNEL RELEASE  1992  . COLONOSCOPY  2010   cleared for 10 yrs- Dr Bluford Kaufmannh  . LITHOTRIPSY  07/2014    Family History  Problem Relation Age of Onset  . Cancer Mother   . Heart disease Mother   . Cancer Father     Social History   Socioeconomic History  . Marital status: Married    Spouse name: Not on file  . Number of children: Not on file  . Years of education: Not on file  . Highest education level: Not on file  Social Needs  . Financial resource strain: Not on file  . Food insecurity - worry: Not on file  . Food insecurity - inability: Not on file  . Transportation needs - medical: Not on file  . Transportation needs - non-medical: Not on file  Occupational History  . Not on file  Tobacco Use  . Smoking status: Current Some Day Smoker    Types: Cigarettes  . Smokeless tobacco: Never Used  Substance and Sexual Activity  . Alcohol use: No     Alcohol/week: 0.0 oz  . Drug use: No  . Sexual activity: No  Other Topics Concern  . Not on file  Social History Narrative  . Not on file    No Known Allergies  Outpatient Medications Prior to Visit  Medication Sig Dispense Refill  . aspirin 81 MG EC tablet Take 81 mg by mouth daily.    . cetirizine (ZYRTEC) 5 MG tablet Take 5 mg by mouth daily.    . ranitidine (ZANTAC) 150 MG tablet Take 300 mg by mouth daily.    Marland Kitchen. amoxicillin-clavulanate (AUGMENTIN) 875-125 MG tablet Take 1 tablet by mouth 2 (two) times daily. 20 tablet 0  . guaiFENesin-codeine (ROBITUSSIN AC) 100-10 MG/5ML syrup Take 5 mLs by mouth 3 (three) times daily as needed for cough. 150 mL 0  . sertraline (ZOLOFT) 50 MG tablet Take 1 tablet (50 mg total) by mouth daily. (Patient not taking: Reported on 12/13/2016) 30 tablet 3   No facility-administered medications prior to visit.     Review of Systems  Constitutional: Negative for chills, fever, malaise/fatigue and weight loss.  HENT: Negative for ear discharge, ear pain and sore throat.   Eyes: Negative for blurred vision.  Respiratory: Negative for cough, sputum production, shortness of breath and wheezing.   Cardiovascular: Negative for chest pain,  palpitations and leg swelling.  Gastrointestinal: Negative for abdominal pain, blood in stool, constipation, diarrhea, heartburn, melena, nausea and vomiting.  Genitourinary: Positive for flank pain and frequency. Negative for dysuria, hematuria, hesitancy and urgency.  Musculoskeletal: Negative for back pain, joint pain, myalgias and neck pain.  Skin: Negative for rash.  Neurological: Negative for dizziness, tingling, sensory change, focal weakness and headaches.  Endo/Heme/Allergies: Negative for environmental allergies and polydipsia. Does not bruise/bleed easily.  Psychiatric/Behavioral: Negative for depression and suicidal ideas. The patient is not nervous/anxious and does not have insomnia.       Objective  Vitals:   04/22/17 1424  BP: (!) 140/100  Pulse: 76  Weight: 211 lb (95.7 kg)  Height: 5\' 7"  (1.702 m)    Physical Exam  Constitutional: He is oriented to person, place, and time and well-developed, well-nourished, and in no distress.  HENT:  Head: Normocephalic.  Right Ear: External ear normal.  Left Ear: External ear normal.  Nose: Nose normal.  Mouth/Throat: Oropharynx is clear and moist.  Eyes: Conjunctivae and EOM are normal. Pupils are equal, round, and reactive to light. Right eye exhibits no discharge. Left eye exhibits no discharge. No scleral icterus.  Neck: Normal range of motion. Neck supple. No JVD present. No tracheal deviation present. No thyromegaly present.  Cardiovascular: Normal rate, regular rhythm, normal heart sounds and intact distal pulses. Exam reveals no gallop and no friction rub.  No murmur heard. Pulmonary/Chest: Breath sounds normal. No respiratory distress. He has no wheezes. He has no rales.  Abdominal: Soft. Bowel sounds are normal. He exhibits no mass. There is no hepatosplenomegaly. There is no tenderness. There is no rebound, no guarding and no CVA tenderness.  Genitourinary: Rectum normal. Prostate is enlarged and tender.  Genitourinary Comments: Boggy/ no nodularity  Musculoskeletal: Normal range of motion. He exhibits no edema or tenderness.  Lymphadenopathy:    He has no cervical adenopathy.  Neurological: He is alert and oriented to person, place, and time. He has normal sensation, normal strength, normal reflexes and intact cranial nerves. No cranial nerve deficit.  Skin: Skin is warm. No rash noted.  Psychiatric: Mood and affect normal.  Nursing note and vitals reviewed.     Assessment & Plan  Problem List Items Addressed This Visit    None    Visit Diagnoses    Acute prostatitis    -  Primary   Relevant Medications   doxycycline (VIBRA-TABS) 100 MG tablet   Other Relevant Orders   POCT Urinalysis Dipstick  (Completed)      Meds ordered this encounter  Medications  . doxycycline (VIBRA-TABS) 100 MG tablet    Sig: Take 1 tablet (100 mg total) by mouth 2 (two) times daily.    Dispense:  28 tablet    Refill:  0      Dr. Elizabeth Sauer [facility]  04/22/17

## 2017-12-01 ENCOUNTER — Encounter: Payer: Self-pay | Admitting: Family Medicine

## 2017-12-01 ENCOUNTER — Ambulatory Visit (INDEPENDENT_AMBULATORY_CARE_PROVIDER_SITE_OTHER): Payer: No Typology Code available for payment source | Admitting: Family Medicine

## 2017-12-01 VITALS — BP 122/80 | HR 80 | Ht 67.0 in | Wt 213.0 lb

## 2017-12-01 DIAGNOSIS — J40 Bronchitis, not specified as acute or chronic: Secondary | ICD-10-CM

## 2017-12-01 DIAGNOSIS — F17210 Nicotine dependence, cigarettes, uncomplicated: Secondary | ICD-10-CM | POA: Diagnosis not present

## 2017-12-01 DIAGNOSIS — J0100 Acute maxillary sinusitis, unspecified: Secondary | ICD-10-CM

## 2017-12-01 MED ORDER — GUAIFENESIN-CODEINE 100-10 MG/5ML PO SYRP: 5.0000 mL | ORAL_SOLUTION | Freq: Three times a day (TID) | ORAL | 0 refills | Status: DC | PRN

## 2017-12-01 MED ORDER — AMOXICILLIN-POT CLAVULANATE 875-125 MG PO TABS: 1.0000 | ORAL_TABLET | Freq: Two times a day (BID) | ORAL | 0 refills | Status: DC

## 2017-12-01 NOTE — Progress Notes (Signed)
Name: Blake Phelps   MRN: [id number]    DOB: [birth year]   Date:12/01/2017       Progress Note  Subjective  Chief Complaint  Chief Complaint  Patient presents with  . Sinusitis    cough with clear production, ear pressure    Sinusitis  This is a new problem. The current episode started in the past 7 days. The problem has been gradually worsening since onset. There has been no fever. The fever has been present for 5 days or more. The pain is mild. Pertinent negatives include no chills, congestion, coughing, diaphoresis, ear pain, headaches, hoarse voice, neck pain, shortness of breath, sinus pressure, sneezing, sore throat or swollen glands. Past treatments include nothing. The treatment provided mild relief.    No problem-specific Assessment & Plan notes found for this encounter.   Past Medical History:  Diagnosis Date  . Allergy   . GERD (gastroesophageal reflux disease)     Past Surgical History:  Procedure Laterality Date  . CARPAL TUNNEL RELEASE  1992  . COLONOSCOPY  2010   cleared for 10 yrs- Dr Bluford Kaufmannh  . LITHOTRIPSY  07/2014    Family History  Problem Relation Age of Onset  . Cancer Mother   . Heart disease Mother   . Cancer Father     Social History   Socioeconomic History  . Marital status: Married    Spouse name: Not on file  . Number of children: Not on file  . Years of education: Not on file  . Highest education level: Not on file  Occupational History  . Not on file  Social Needs  . Financial resource strain: Not on file  . Food insecurity:    Worry: Not on file    Inability: Not on file  . Transportation needs:    Medical: Not on file    Non-medical: Not on file  Tobacco Use  . Smoking status: Current Some Day Smoker    Types: Cigarettes  . Smokeless tobacco: Never Used  Substance and Sexual Activity  . Alcohol use: No    Alcohol/week: 0.0 standard drinks  . Drug use: No  . Sexual activity: Never  Lifestyle  . Physical activity:    Days  per week: Not on file    Minutes per session: Not on file  . Stress: Not on file  Relationships  . Social connections:    Talks on phone: Not on file    Gets together: Not on file    Attends religious service: Not on file    Active member of club or organization: Not on file    Attends meetings of clubs or organizations: Not on file    Relationship status: Not on file  . Intimate partner violence:    Fear of current or ex partner: Not on file    Emotionally abused: Not on file    Physically abused: Not on file    Forced sexual activity: Not on file  Other Topics Concern  . Not on file  Social History Narrative  . Not on file    No Known Allergies  Outpatient Medications Prior to Visit  Medication Sig Dispense Refill  . aspirin 81 MG EC tablet Take 81 mg by mouth daily.    . cetirizine (ZYRTEC) 5 MG tablet Take 5 mg by mouth daily.    . ranitidine (ZANTAC) 150 MG tablet Take 300 mg by mouth daily.    Marland Kitchen. doxycycline (VIBRA-TABS) 100 MG tablet Take 1 tablet (  100 mg total) by mouth 2 (two) times daily. 28 tablet 0   No facility-administered medications prior to visit.     Review of Systems  Constitutional: Negative for chills, diaphoresis, fever, malaise/fatigue and weight loss.  HENT: Negative for congestion, ear discharge, ear pain, hoarse voice, sinus pressure, sneezing and sore throat.   Eyes: Negative for blurred vision.  Respiratory: Negative for cough, sputum production, shortness of breath and wheezing.   Cardiovascular: Negative for chest pain, palpitations and leg swelling.  Gastrointestinal: Negative for abdominal pain, blood in stool, constipation, diarrhea, heartburn, melena and nausea.  Genitourinary: Negative for dysuria, frequency, hematuria and urgency.  Musculoskeletal: Negative for back pain, joint pain, myalgias and neck pain.  Skin: Negative for rash.  Neurological: Negative for dizziness, tingling, sensory change, focal weakness and headaches.   Endo/Heme/Allergies: Negative for environmental allergies and polydipsia. Does not bruise/bleed easily.  Psychiatric/Behavioral: Negative for depression and suicidal ideas. The patient is not nervous/anxious and does not have insomnia.      Objective  Vitals:   12/01/17 1055  BP: 122/80  Pulse: 80  SpO2: 98%  Weight: 213 lb (96.6 kg)  Height: 5\' 7"  (1.702 m)    Physical Exam  Constitutional: He is oriented to person, place, and time.  HENT:  Head: Normocephalic.  Right Ear: Hearing, tympanic membrane, external ear and ear canal normal.  Left Ear: Hearing, tympanic membrane, external ear and ear canal normal.  Nose: Mucosal edema present. Right sinus exhibits maxillary sinus tenderness. Right sinus exhibits no frontal sinus tenderness. Left sinus exhibits maxillary sinus tenderness. Left sinus exhibits no frontal sinus tenderness.  Mouth/Throat: Uvula is midline. Posterior oropharyngeal erythema present. No oropharyngeal exudate or posterior oropharyngeal edema.  Eyes: Pupils are equal, round, and reactive to light. Conjunctivae and EOM are normal. Right eye exhibits no discharge. Left eye exhibits no discharge. No scleral icterus.  Neck: Normal range of motion. Neck supple. No JVD present. No tracheal deviation present. No thyromegaly present.  Cardiovascular: Normal rate, regular rhythm, normal heart sounds and intact distal pulses. Exam reveals no gallop and no friction rub.  No murmur heard. Pulmonary/Chest: Breath sounds normal. Stridor present. No respiratory distress. He has no decreased breath sounds. He has no wheezes. He has no rales.  Abdominal: Soft. Bowel sounds are normal. He exhibits no mass. There is no hepatosplenomegaly. There is no tenderness. There is no rebound, no guarding and no CVA tenderness.  Musculoskeletal: Normal range of motion. He exhibits no edema or tenderness.  Lymphadenopathy:    He has no cervical adenopathy.  Neurological: He is alert and  oriented to person, place, and time. He has normal strength and normal reflexes. No cranial nerve deficit.  Skin: Skin is warm. No rash noted.  Nursing note and vitals reviewed.     Assessment & Plan  Problem List Items Addressed This Visit    None    Visit Diagnoses    Acute maxillary sinusitis, recurrence not specified    -  Primary   start Augmentin and cough syrup   Relevant Medications   amoxicillin-clavulanate (AUGMENTIN) 875-125 MG tablet   guaiFENesin-codeine (ROBITUSSIN AC) 100-10 MG/5ML syrup   Bronchitis       gave Augmentin and cough syrup   Relevant Medications   guaiFENesin-codeine (ROBITUSSIN AC) 100-10 MG/5ML syrup   Cigarette nicotine dependence without complication       discussed smoking sesation/ 3rd hand smoke      Meds ordered this encounter  Medications  . amoxicillin-clavulanate (AUGMENTIN)  875-125 MG tablet    Sig: Take 1 tablet by mouth 2 (two) times daily.    Dispense:  20 tablet    Refill:  0  . guaiFENesin-codeine (ROBITUSSIN AC) 100-10 MG/5ML syrup    Sig: Take 5 mLs by mouth 3 (three) times daily as needed for cough.    Dispense:  100 mL    Refill:  0      Dr. Elizabeth Sauer [facility]  12/01/17

## 2018-04-04 ENCOUNTER — Ambulatory Visit: Payer: Self-pay | Admitting: Family Medicine

## 2018-06-05 ENCOUNTER — Encounter: Payer: Self-pay | Admitting: Family Medicine

## 2018-06-05 ENCOUNTER — Other Ambulatory Visit: Payer: Self-pay

## 2018-06-05 ENCOUNTER — Ambulatory Visit: Payer: Managed Care, Other (non HMO) | Admitting: Family Medicine

## 2018-06-05 VITALS — BP 130/70 | HR 60 | Ht 67.0 in | Wt 205.0 lb

## 2018-06-05 DIAGNOSIS — Z23 Encounter for immunization: Secondary | ICD-10-CM

## 2018-06-05 DIAGNOSIS — H9319 Tinnitus, unspecified ear: Secondary | ICD-10-CM

## 2018-06-05 DIAGNOSIS — H6980 Other specified disorders of Eustachian tube, unspecified ear: Secondary | ICD-10-CM | POA: Diagnosis not present

## 2018-06-05 MED ORDER — FLUTICASONE PROPIONATE 50 MCG/ACT NA SUSP: 2.0000 | Freq: Every day | NASAL | 6 refills | Status: DC

## 2018-06-05 MED ORDER — PREDNISONE 10 MG PO TABS
10.0000 mg | ORAL_TABLET | Freq: Every day | ORAL | 0 refills | Status: DC
Start: 2018-06-05 — End: 2018-06-26

## 2018-06-05 NOTE — Patient Instructions (Signed)
Tinnitus  Tinnitus refers to hearing a sound when there is no actual source for that sound. This is often described as ringing in the ears. However, people with this condition may hear a variety of noises, in one ear or in both ears.  The sounds of tinnitus can be soft, loud, or somewhere in between. Tinnitus can last for a few seconds or can be constant for days. It may go away without treatment and come back at various times. When tinnitus is constant or happens often, it can lead to other problems, such as trouble sleeping and trouble concentrating.  Almost everyone experiences tinnitus at some point. Tinnitus that is long-lasting (chronic) or comes back often (recurs) may require medical attention.  What are the causes?  The cause of tinnitus is often not known. In some cases, it can result from other problems or conditions, including:  · Exposure to loud noises from machinery, music, or other sources.  · Hearing loss.  · Ear or sinus infections.  · Earwax buildup.  · An object (foreign body) stuck in the ear.  · Taking certain medicines.  · Drinking alcohol or caffeine.  · High blood pressure.  · Heart diseases.  · Anemia.  · Allergies.  · Meniere's disease.  · Thyroid problems.  · Tumors.  · A weak, bulging blood vessel (aneurysm) near the ear.  · Depression or other mood disorders.  What are the signs or symptoms?  The main symptom of tinnitus is hearing a sound when there is no source for that sound. It may sound like:  · Buzzing.  · Roaring.  · Ringing.  · Blowing air, like the sound heard when you listen to a seashell.  · Hissing.  · Whistling.  · Sizzling.  · Humming.  · Running water.  · A musical note.  · Tapping.  Symptoms may affect only one ear (unilateral) or both ears (bilateral).  How is this diagnosed?  Tinnitus is diagnosed based on your symptoms, your medical history, and a physical exam. Your health care provider may do a thorough hearing test (audiologic exam) if your tinnitus:  · Is  unilateral.  · Causes hearing difficulties.  · Lasts 6 months or longer.  You may work with a health care provider who specializes in hearing disorders (audiologist). You may be asked questions about your symptoms and how they affect your daily life. You may have other tests done, such as:  · CT scan.  · MRI.  · An imaging test of how blood flows through your blood vessels (angiogram).  How is this treated?  Treating an underlying medical condition can sometimes make tinnitus go away. If your tinnitus continues, other treatments may include:  · Medicines, such as antidepressants or sleeping aids.  · Sound generators to mask the tinnitus. These include:  ? Tabletop sound machines that play relaxing sounds to help you fall asleep.  ? Wearable devices that fit in your ear and play sounds or music.  ? Acoustic neural stimulation. This involves using headphones to listen to music that contains an auditory signal. Over time, listening to this signal may change some pathways in your brain and make you less sensitive to tinnitus. This treatment is used for very severe cases when no other treatment is working.  · Therapy and counseling to help you manage the stress of living with tinnitus.  · Using hearing aids or cochlear implants if your tinnitus is related to hearing loss. Hearing aids are worn in   the outer ear. Cochlear implants are surgically placed in the inner ear.  Follow these instructions at home:  Managing symptoms         · When possible, avoid being in loud places and being exposed to loud sounds.  · Wear hearing protection, such as earplugs, when you are exposed to loud noises.  · Use a white noise machine, a humidifier, or other devices to mask the sound of tinnitus.  · Practice techniques for reducing stress, such as meditation, yoga, or deep breathing. Work with your health care provider if you need help with managing stress.  · Sleep with your head slightly raised. This may reduce the impact of  tinnitus.  General instructions  · Do not use stimulants, such as nicotine, alcohol, or caffeine. Talk with your health care provider about other stimulants to avoid. Stimulants are substances that can make you feel alert and attentive by increasing certain activities in the body (such as heart rate and blood pressure). These substances may make tinnitus worse.  · Take over-the-counter and prescription medicines only as told by your health care provider.  · Try to get plenty of sleep each night.  · Keep all follow-up visits as told by your health care provider. This is important.  Contact a health care provider if:  · Your tinnitus continues for 3 weeks or longer without stopping.  · Your symptoms get worse or do not get better with home care.  · You develop tinnitus after a head injury.  · You have tinnitus along with any of the following:  ? Dizziness.  ? Loss of balance.  ? Nausea and vomiting.  Summary  · Tinnitus refers to hearing a sound when there is no actual source for that sound. This is often described as ringing in the ears.  · Symptoms may affect only one ear (unilateral) or both ears (bilateral).  · Use a white noise machine, a humidifier, or other devices to mask the sound of tinnitus.  · Do not use stimulants, such as nicotine, alcohol, or caffeine. Talk with your health care provider about other stimulants to avoid. These substances may make tinnitus worse.  This information is not intended to replace advice given to you by your health care provider. Make sure you discuss any questions you have with your health care provider.  Document Released: 03/08/2005 Document Revised: 12/16/2016 Document Reviewed: 12/16/2016  [employer] Interactive Patient Education © 2019 [employer]

## 2018-06-05 NOTE — Progress Notes (Signed)
Date:  06/05/2018   Name:  Blake Phelps   DOB:  16-Dec-1960   MRN:  [id number]   Chief Complaint: Tinnitus (L) ear ringing "like I've been at a concert")  Otalgia   There is pain in the left (for tinnitis) ear. This is a new problem. The current episode started 1 to 4 weeks ago. The problem occurs constantly. The problem has been waxing and waning. There has been no fever. The pain is at a severity of 0/10. The patient is experiencing no pain. Associated symptoms include hearing loss and rhinorrhea. Pertinent negatives include no abdominal pain, coughing, diarrhea, ear discharge, headaches, neck pain, rash, sore throat or vomiting. He has tried nothing for the symptoms. The treatment provided no relief. His past medical history is significant for hearing loss. There is no history of a chronic ear infection or a tympanostomy tube.    Review of Systems  Constitutional: Negative for chills and fever.  HENT: Positive for ear pain, hearing loss and rhinorrhea. Negative for drooling, ear discharge and sore throat.   Respiratory: Negative for cough, shortness of breath and wheezing.   Cardiovascular: Negative for chest pain, palpitations and leg swelling.  Gastrointestinal: Negative for abdominal pain, blood in stool, constipation, diarrhea, nausea and vomiting.  Endocrine: Negative for polydipsia.  Genitourinary: Negative for dysuria, frequency, hematuria and urgency.  Musculoskeletal: Negative for back pain, myalgias and neck pain.  Skin: Negative for rash.  Allergic/Immunologic: Negative for environmental allergies.  Neurological: Negative for dizziness and headaches.  Hematological: Does not bruise/bleed easily.  Psychiatric/Behavioral: Negative for suicidal ideas. The patient is not nervous/anxious.     Patient Active Problem List   Diagnosis Date Noted  . Hydronephrosis with renal and ureteral calculus obstruction 07/18/2014    No Known Allergies  Past Surgical History:   Procedure Laterality Date  . CARPAL TUNNEL RELEASE  1992  . COLONOSCOPY  2010   cleared for 10 yrs- Dr Bluford Kaufmann  . LITHOTRIPSY  07/2014    Social History   Tobacco Use  . Smoking status: Current Some Day Smoker    Types: Cigarettes  . Smokeless tobacco: Never Used  Substance Use Topics  . Alcohol use: No    Alcohol/week: 0.0 standard drinks  . Drug use: No     Medication list has been reviewed and updated.  Current Meds  Medication Sig  . aspirin 81 MG EC tablet Take 81 mg by mouth daily.  . cetirizine (ZYRTEC) 5 MG tablet Take 5 mg by mouth daily.  . ranitidine (ZANTAC) 150 MG tablet Take 150 mg by mouth daily.     PHQ 2/9 Scores 12/01/2017 12/13/2016  PHQ - 2 Score 0 3  PHQ- 9 Score - 9    Physical Exam Vitals signs and nursing note reviewed.  HENT:     Head: Normocephalic.     Jaw: There is normal jaw occlusion.     Right Ear: Tympanic membrane, ear canal and external ear normal. Decreased hearing noted. Tympanic membrane is not retracted.     Left Ear: Ear canal and external ear normal. Decreased hearing noted. Tympanic membrane is retracted.     Nose: Nose normal.  Eyes:     General: Lids are normal. No scleral icterus.       Right eye: No discharge.        Left eye: No discharge.     Conjunctiva/sclera: Conjunctivae normal.     Pupils: Pupils are equal, round, and reactive to light.  Neck:     Musculoskeletal: Normal range of motion and neck supple.     Thyroid: No thyromegaly.     Vascular: No JVD.     Trachea: No tracheal deviation.  Cardiovascular:     Rate and Rhythm: Normal rate and regular rhythm.     Heart sounds: Normal heart sounds. No murmur. No friction rub. No gallop.   Pulmonary:     Effort: No respiratory distress.     Breath sounds: Normal breath sounds. No wheezing or rales.  Abdominal:     General: Bowel sounds are normal.     Palpations: Abdomen is soft. There is no mass.     Tenderness: There is no abdominal tenderness. There is no  guarding or rebound.  Musculoskeletal: Normal range of motion.        General: No tenderness.  Lymphadenopathy:     Cervical: No cervical adenopathy.  Skin:    General: Skin is warm.     Findings: No rash.  Neurological:     Mental Status: He is alert and oriented to person, place, and time.     Cranial Nerves: No cranial nerve deficit.     Deep Tendon Reflexes: Reflexes are normal and symmetric.     Wt Readings from Last 3 Encounters:  06/05/18 205 lb (93 kg)  12/01/17 213 lb (96.6 kg)  04/22/17 211 lb (95.7 kg)    BP 130/70   Pulse 60   Ht 5\' 7"  (1.702 m)   Wt 205 lb (93 kg)   BMI 32.11 kg/m   Assessment and Plan: 1. Tinnitus, unspecified laterality Acute. Has had problem in the past in which he saw Dr Elenore Rota. Will try Flonase nasal spray and prednisone for 14 days. If not better, next step Dr Elenore Rota. - fluticasone (FLONASE) 50 MCG/ACT nasal spray; Place 2 sprays into both nostrils daily.  Dispense: 16 g; Refill: 6 - predniSONE (DELTASONE) 10 MG tablet; Take 1 tablet (10 mg total) by mouth daily with breakfast.  Dispense: 14 tablet; Refill: 0  2. Dysfunction of Eustachian tube, unspecified laterality Acute. Left ear tinnitus and discomfort. Will try flonase nasal spray and prednisone. If not better, next step Dr Delfin Edis - fluticasone (FLONASE) 50 MCG/ACT nasal spray; Place 2 sprays into both nostrils daily.  Dispense: 16 g; Refill: 6 - predniSONE (DELTASONE) 10 MG tablet; Take 1 tablet (10 mg total) by mouth daily with breakfast.  Dispense: 14 tablet; Refill: 0  3. Need for Tdap vaccination Discussed and administered - Tdap vaccine greater than or equal to 7yo IM

## 2018-06-26 ENCOUNTER — Other Ambulatory Visit: Payer: Self-pay

## 2018-06-26 ENCOUNTER — Encounter: Payer: Self-pay | Admitting: Family Medicine

## 2018-06-26 ENCOUNTER — Ambulatory Visit: Payer: Managed Care, Other (non HMO) | Admitting: Family Medicine

## 2018-06-26 VITALS — BP 128/74 | HR 86 | Ht 67.0 in | Wt 204.0 lb

## 2018-06-26 DIAGNOSIS — K219 Gastro-esophageal reflux disease without esophagitis: Secondary | ICD-10-CM | POA: Diagnosis not present

## 2018-06-26 DIAGNOSIS — N200 Calculus of kidney: Secondary | ICD-10-CM | POA: Diagnosis not present

## 2018-06-26 DIAGNOSIS — R1011 Right upper quadrant pain: Secondary | ICD-10-CM | POA: Diagnosis not present

## 2018-06-26 LAB — POCT URINALYSIS DIPSTICK
Bilirubin, UA: NEGATIVE
Glucose, UA: NEGATIVE
Ketones, UA: NEGATIVE
Leukocytes, UA: NEGATIVE
Nitrite, UA: NEGATIVE
Protein, UA: NEGATIVE
Spec Grav, UA: 1.015 (ref 1.010–1.025)
Urobilinogen, UA: 0.2 E.U./dL
pH, UA: 6 (ref 5.0–8.0)

## 2018-06-26 MED ORDER — TAMSULOSIN HCL 0.4 MG PO CAPS: 0.4000 mg | ORAL_CAPSULE | Freq: Every day | ORAL | 3 refills | Status: DC

## 2018-06-26 MED ORDER — FAMOTIDINE 40 MG PO TABS: 40.0000 mg | ORAL_TABLET | Freq: Every day | ORAL | 1 refills | Status: DC

## 2018-06-26 MED ORDER — TRAMADOL HCL 50 MG PO TABS: 50.0000 mg | ORAL_TABLET | Freq: Three times a day (TID) | ORAL | 0 refills | Status: DC | PRN

## 2018-06-26 NOTE — Progress Notes (Signed)
Date:  06/26/2018   Name:  Blake Phelps   DOB:  1960-11-21   MRN:  [id number]   Chief Complaint: Urinary Urgency (Passed a stone 2 weeks ago. This morning noticed back pain. Cannot sit still in the chair and having serious urgency to go. ) and Gastroesophageal Reflux (Needs replacement for ZANTAC)  Gastroesophageal Reflux  He reports no abdominal pain, no belching, no chest pain, no choking, no coughing, no dysphagia, no early satiety, no globus sensation, no heartburn, no hoarse voice, no nausea, no sore throat, no stridor, no tooth decay, no water brash or no wheezing. This is a chronic problem. The current episode started more than 1 year ago. The problem has been gradually improving. The symptoms are aggravated by certain foods. Pertinent negatives include no anemia, fatigue, melena, muscle weakness, orthopnea or weight loss. There are no known risk factors. He has tried a histamine-2 antagonist (ranididine) for the symptoms. The treatment provided moderate relief. Past procedures do not include an abdominal ultrasound, an EGD, esophageal manometry, esophageal pH monitoring, H. pylori antibody titer or a UGI.  Hematuria  This is a chronic problem. The current episode started more than 1 year ago. The problem has been waxing and waning since onset. He describes the hematuria as gross (s/p hemolysis) hematuria. He reports no clotting in his urine stream. His pain is at a severity of 9/10. The pain is moderate. He describes his urine color as tea colored. Irritative symptoms do not include frequency, nocturia or urgency. Obstructive symptoms do not include straining or a weak stream. Pertinent negatives include no abdominal pain, chills, dysuria, fever, flank pain or nausea.    Review of Systems  Constitutional: Negative for chills, fatigue, fever and weight loss.  HENT: Negative for drooling, ear discharge, ear pain, hoarse voice and sore throat.   Respiratory: Negative for cough, choking,  shortness of breath and wheezing.   Cardiovascular: Negative for chest pain, palpitations and leg swelling.  Gastrointestinal: Negative for abdominal pain, blood in stool, constipation, diarrhea, dysphagia, heartburn, melena and nausea.  Endocrine: Negative for polydipsia.  Genitourinary: Positive for hematuria. Negative for decreased urine volume, dysuria, enuresis, flank pain, frequency, nocturia, penile pain, penile swelling and urgency.  Musculoskeletal: Negative for back pain, myalgias, muscle weakness and neck pain.  Skin: Negative for rash.  Allergic/Immunologic: Negative for environmental allergies.  Neurological: Negative for dizziness and headaches.  Hematological: Does not bruise/bleed easily.  Psychiatric/Behavioral: Negative for suicidal ideas. The patient is not nervous/anxious.     Patient Active Problem List   Diagnosis Date Noted  . Hydronephrosis with renal and ureteral calculus obstruction 07/18/2014    No Known Allergies  Past Surgical History:  Procedure Laterality Date  . CARPAL TUNNEL RELEASE  1992  . COLONOSCOPY  2010   cleared for 10 yrs- Dr Bluford Kaufmann  . LITHOTRIPSY  07/2014    Social History   Tobacco Use  . Smoking status: Current Some Day Smoker    Types: Cigarettes  . Smokeless tobacco: Never Used  Substance Use Topics  . Alcohol use: No    Alcohol/week: 0.0 standard drinks  . Drug use: No     Medication list has been reviewed and updated.  Current Meds  Medication Sig  . aspirin 81 MG EC tablet Take 81 mg by mouth daily.  . cetirizine (ZYRTEC) 5 MG tablet Take 5 mg by mouth daily.  . fluticasone (FLONASE) 50 MCG/ACT nasal spray Place 2 sprays into both nostrils daily.  PHQ 2/9 Scores 12/01/2017 12/13/2016  PHQ - 2 Score 0 3  PHQ- 9 Score - 9    BP Readings from Last 3 Encounters:  06/26/18 128/74  06/05/18 130/70  12/01/17 122/80    Physical Exam Vitals signs and nursing note reviewed.  HENT:     Head: Normocephalic.     Right  Ear: External ear normal.     Left Ear: External ear normal.     Nose: Nose normal.  Eyes:     General: No scleral icterus.       Right eye: No discharge.        Left eye: No discharge.     Conjunctiva/sclera: Conjunctivae normal.     Pupils: Pupils are equal, round, and reactive to light.  Neck:     Musculoskeletal: Normal range of motion and neck supple.     Thyroid: No thyromegaly.     Vascular: No JVD.     Trachea: No tracheal deviation.  Cardiovascular:     Rate and Rhythm: Normal rate and regular rhythm.     Heart sounds: Normal heart sounds. No murmur. No friction rub. No gallop.   Pulmonary:     Effort: No respiratory distress.     Breath sounds: Normal breath sounds. No wheezing or rales.  Abdominal:     General: Bowel sounds are normal.     Palpations: Abdomen is soft. There is no mass.     Tenderness: There is no abdominal tenderness. There is no guarding or rebound.  Musculoskeletal: Normal range of motion.        General: No tenderness.  Lymphadenopathy:     Cervical: No cervical adenopathy.  Skin:    General: Skin is warm.     Findings: No rash.  Neurological:     Mental Status: He is alert and oriented to person, place, and time.     Cranial Nerves: No cranial nerve deficit.     Deep Tendon Reflexes: Reflexes are normal and symmetric.     Wt Readings from Last 3 Encounters:  06/26/18 204 lb (92.5 kg)  06/05/18 205 lb (93 kg)  12/01/17 213 lb (96.6 kg)    BP 128/74   Pulse 86   Ht 5\' 7"  (1.702 m)   Wt 204 lb (92.5 kg)   SpO2 96%   BMI 31.95 kg/m   Assessment and Plan:

## 2018-06-29 ENCOUNTER — Other Ambulatory Visit: Payer: Self-pay

## 2018-06-29 ENCOUNTER — Ambulatory Visit
Admission: RE | Admit: 2018-06-29 | Discharge: 2018-06-29 | Disposition: A | Discharge status: Home / Self Care | Payer: Managed Care, Other (non HMO) | Source: Ambulatory Visit | Attending: Family Medicine | Admitting: Family Medicine

## 2018-06-29 DIAGNOSIS — R1011 Right upper quadrant pain: Secondary | ICD-10-CM | POA: Diagnosis present

## 2018-08-28 ENCOUNTER — Other Ambulatory Visit: Payer: Self-pay

## 2018-08-28 DIAGNOSIS — K219 Gastro-esophageal reflux disease without esophagitis: Secondary | ICD-10-CM

## 2018-08-28 MED ORDER — FAMOTIDINE 40 MG PO TABS: 40.0000 mg | ORAL_TABLET | Freq: Every day | ORAL | 1 refills | Status: DC

## 2018-12-29 ENCOUNTER — Ambulatory Visit: Payer: Managed Care, Other (non HMO) | Admitting: Family Medicine

## 2018-12-29 ENCOUNTER — Encounter: Payer: Self-pay | Admitting: Family Medicine

## 2018-12-29 ENCOUNTER — Other Ambulatory Visit: Payer: Self-pay

## 2018-12-29 VITALS — BP 130/80 | HR 72 | Ht 67.0 in | Wt 199.0 lb

## 2018-12-29 DIAGNOSIS — H6980 Other specified disorders of Eustachian tube, unspecified ear: Secondary | ICD-10-CM

## 2018-12-29 DIAGNOSIS — Z1211 Encounter for screening for malignant neoplasm of colon: Secondary | ICD-10-CM | POA: Diagnosis not present

## 2018-12-29 DIAGNOSIS — Z23 Encounter for immunization: Secondary | ICD-10-CM | POA: Diagnosis not present

## 2018-12-29 DIAGNOSIS — K5792 Diverticulitis of intestine, part unspecified, without perforation or abscess without bleeding: Secondary | ICD-10-CM

## 2018-12-29 DIAGNOSIS — H9319 Tinnitus, unspecified ear: Secondary | ICD-10-CM | POA: Diagnosis not present

## 2018-12-29 MED ORDER — AMOXICILLIN-POT CLAVULANATE 875-125 MG PO TABS: 1.0000 | ORAL_TABLET | Freq: Two times a day (BID) | ORAL | 0 refills | Status: DC

## 2018-12-29 MED ORDER — METRONIDAZOLE 500 MG PO TABS: 500.0000 mg | ORAL_TABLET | Freq: Three times a day (TID) | ORAL | 0 refills | Status: DC

## 2018-12-29 MED ORDER — FLUTICASONE PROPIONATE 50 MCG/ACT NA SUSP: 2.0000 | Freq: Every day | NASAL | 6 refills | Status: DC

## 2018-12-29 NOTE — Patient Instructions (Signed)
Diverticulitis  Diverticulitis is infection or inflammation of small pouches (diverticula) in the colon that form due to a condition called diverticulosis. Diverticula can trap stool (feces) and bacteria, causing infection and inflammation. Diverticulitis may cause severe stomach pain and diarrhea. It may lead to tissue damage in the colon that causes bleeding. The diverticula may also burst (rupture) and cause infected stool to enter other areas of the abdomen. Complications of diverticulitis can include:  Bleeding.  Severe infection.  Severe pain.  Rupture (perforation) of the colon.  Blockage (obstruction) of the colon. What are the causes? This condition is caused by stool becoming trapped in the diverticula, which allows bacteria to grow in the diverticula. This leads to inflammation and infection. What increases the risk? You are more likely to develop this condition if:  You have diverticulosis. The risk for diverticulosis increases if: ? You are overweight or obese. ? You use tobacco products. ? You do not get enough exercise.  You eat a diet that does not include enough fiber. High-fiber foods include fruits, vegetables, beans, nuts, and whole grains. What are the signs or symptoms? Symptoms of this condition may include:  Pain and tenderness in the abdomen. The pain is normally located on the left side of the abdomen, but it may occur in other areas.  Fever and chills.  Bloating.  Cramping.  Nausea.  Vomiting.  Changes in bowel routines.  Blood in your stool. How is this diagnosed? This condition is diagnosed based on:  Your medical history.  A physical exam.  Tests to make sure there is nothing else causing your condition. These tests may include: ? Blood tests. ? Urine tests. ? Imaging tests of the abdomen, including X-rays, ultrasounds, MRIs, or CT scans. How is this treated? Most cases of this condition are mild and can be treated at home.  Treatment may include:  Taking over-the-counter pain medicines.  Following a clear liquid diet.  Taking antibiotic medicines by mouth.  Rest. More severe cases may need to be treated at a hospital. Treatment may include:  Not eating or drinking.  Taking prescription pain medicine.  Receiving antibiotic medicines through an IV tube.  Receiving fluids and nutrition through an IV tube.  Surgery. When your condition is under control, your health care provider may recommend that you have a colonoscopy. This is an exam to look at the entire large intestine. During the exam, a lubricated, bendable tube is inserted into the anus and then passed into the rectum, colon, and other parts of the large intestine. A colonoscopy can show how severe your diverticula are and whether something else may be causing your symptoms. Follow these instructions at home: Medicines  Take over-the-counter and prescription medicines only as told by your health care provider. These include fiber supplements, probiotics, and stool softeners.  If you were prescribed an antibiotic medicine, take it as told by your health care provider. Do not stop taking the antibiotic even if you start to feel better.  Do not drive or use heavy machinery while taking prescription pain medicine. General instructions   Follow a full liquid diet or another diet as directed by your health care provider. After your symptoms improve, your health care provider may tell you to change your diet. He or she may recommend that you eat a diet that contains at least 25 g (25 grams) of fiber daily. Fiber makes it easier to pass stool. Healthy sources of fiber include: ? Berries. One cup contains 4-8 grams of   fiber. ? Beans or lentils. One half cup contains 5-8 grams of fiber. ? Green vegetables. One cup contains 4 grams of fiber.  Exercise for at least 30 minutes, 3 times each week. You should exercise hard enough to raise your heart rate and  break a sweat.  Keep all follow-up visits as told by your health care provider. This is important. You may need a colonoscopy. Contact a health care provider if:  Your pain does not improve.  You have a hard time drinking or eating food.  Your bowel movements do not return to normal. Get help right away if:  Your pain gets worse.  Your symptoms do not get better with treatment.  Your symptoms suddenly get worse.  You have a fever.  You vomit more than one time.  You have stools that are bloody, black, or tarry. Summary  Diverticulitis is infection or inflammation of small pouches (diverticula) in the colon that form due to a condition called diverticulosis. Diverticula can trap stool (feces) and bacteria, causing infection and inflammation.  You are at higher risk for this condition if you have diverticulosis and you eat a diet that does not include enough fiber.  Most cases of this condition are mild and can be treated at home. More severe cases may need to be treated at a hospital.  When your condition is under control, your health care provider may recommend that you have an exam called a colonoscopy. This exam can show how severe your diverticula are and whether something else may be causing your symptoms. This information is not intended to replace advice given to you by your health care provider. Make sure you discuss any questions you have with your health care provider. Document Released: 12/16/2004 Document Revised: 02/18/2017 Document Reviewed: 04/10/2016 Elsevier Patient Education  2020 [employer]  

## 2018-12-29 NOTE — Progress Notes (Signed)
Date:  12/29/2018   Name:  Blake Phelps   DOB:  12/06/1960   MRN:  [id number]   Chief Complaint: Flank Pain (LLQ pain- relieved by burping and using the BR- diverticulosis found on CT in April)  Flank Pain This is a new problem. The current episode started in the past 7 days (Sunday). The problem occurs daily. The problem has been waxing and waning since onset. The quality of the pain is described as aching. Associated symptoms include abdominal pain. Pertinent negatives include no chest pain, dysuria, fever or headaches. He has tried nothing for the symptoms.  Abdominal Pain This is a chronic problem. The current episode started in the past 7 days. The problem occurs intermittently. The problem has been waxing and waning. The pain is located in the LLQ and left flank. The pain is moderate. Pertinent negatives include no constipation, diarrhea, dysuria, fever, flatus, frequency, headaches, hematochezia, hematuria, melena, myalgias or nausea.    Review of Systems  Constitutional: Negative for chills and fever.  HENT: Negative for drooling, ear discharge, ear pain and sore throat.   Respiratory: Negative for cough, shortness of breath and wheezing.   Cardiovascular: Negative for chest pain, palpitations and leg swelling.  Gastrointestinal: Positive for abdominal pain. Negative for blood in stool, constipation, diarrhea, flatus, hematochezia, melena and nausea.  Endocrine: Negative for polydipsia.  Genitourinary: Positive for flank pain. Negative for dysuria, frequency, hematuria and urgency.  Musculoskeletal: Negative for back pain, myalgias and neck pain.  Skin: Negative for rash.  Allergic/Immunologic: Negative for environmental allergies.  Neurological: Negative for dizziness and headaches.  Hematological: Does not bruise/bleed easily.  Psychiatric/Behavioral: Negative for suicidal ideas. The patient is not nervous/anxious.     Patient Active Problem List   Diagnosis Date Noted   . Hydronephrosis with renal and ureteral calculus obstruction 07/18/2014    No Known Allergies  Past Surgical History:  Procedure Laterality Date  . CARPAL TUNNEL RELEASE  1992  . COLONOSCOPY  2010   cleared for 10 yrs- Dr Candace Cruise  . LITHOTRIPSY  07/2014    Social History   Tobacco Use  . Smoking status: Current Some Day Smoker    Types: Cigarettes  . Smokeless tobacco: Never Used  Substance Use Topics  . Alcohol use: No    Alcohol/week: 0.0 standard drinks  . Drug use: No     Medication list has been reviewed and updated.  Current Meds  Medication Sig  . aspirin 81 MG EC tablet Take 81 mg by mouth daily.  . cetirizine (ZYRTEC) 5 MG tablet Take 5 mg by mouth daily.  . famotidine (PEPCID) 40 MG tablet Take 1 tablet (40 mg total) by mouth daily.  . fluticasone (FLONASE) 50 MCG/ACT nasal spray Place 2 sprays into both nostrils daily.  . tamsulosin (FLOMAX) 0.4 MG CAPS capsule Take 1 capsule (0.4 mg total) by mouth daily.    PHQ 2/9 Scores 12/01/2017 12/13/2016  PHQ - 2 Score 0 3  PHQ- 9 Score - 9    BP Readings from Last 3 Encounters:  12/29/18 130/80  06/26/18 128/74  06/05/18 130/70    Physical Exam Vitals signs and nursing note reviewed.  HENT:     Head: Normocephalic.     Right Ear: Tympanic membrane, ear canal and external ear normal.     Left Ear: Tympanic membrane, ear canal and external ear normal.     Nose: Nose normal.  Eyes:     General: No scleral icterus.  Right eye: No discharge.        Left eye: No discharge.     Conjunctiva/sclera: Conjunctivae normal.     Pupils: Pupils are equal, round, and reactive to light.  Neck:     Musculoskeletal: Normal range of motion and neck supple.     Thyroid: No thyromegaly.     Vascular: No JVD.     Trachea: No tracheal deviation.  Cardiovascular:     Rate and Rhythm: Normal rate and regular rhythm.     Heart sounds: Normal heart sounds. No murmur. No friction rub. No gallop.   Pulmonary:     Effort: No  respiratory distress.     Breath sounds: Normal breath sounds. No wheezing or rales.  Abdominal:     General: Bowel sounds are normal.     Palpations: Abdomen is soft. There is no mass.     Tenderness: There is no abdominal tenderness. There is no guarding or rebound.  Musculoskeletal: Normal range of motion.        General: No tenderness.  Lymphadenopathy:     Cervical: No cervical adenopathy.  Skin:    General: Skin is warm.     Findings: No bruising, erythema or rash.  Neurological:     Mental Status: He is alert and oriented to person, place, and time.     Cranial Nerves: No cranial nerve deficit.     Deep Tendon Reflexes: Reflexes are normal and symmetric.     Wt Readings from Last 3 Encounters:  12/29/18 199 lb (90.3 kg)  06/26/18 204 lb (92.5 kg)  06/05/18 205 lb (93 kg)    BP 130/80   Pulse 72   Ht 5\' 7"  (1.702 m)   Wt 199 lb (90.3 kg)   BMI 31.17 kg/m   Assessment and Plan:  1. Diverticulitis Acute verified diverticulosis present.  Uncontrolled we will initiate metronidazole 500 mg 3 times a day and Augmentin 1 twice a day - metroNIDAZOLE (FLAGYL) 500 MG tablet; Take 1 tablet (500 mg total) by mouth 3 (three) times daily.  Dispense: 30 tablet; Refill: 0 - amoxicillin-clavulanate (AUGMENTIN) 875-125 MG tablet; Take 1 tablet by mouth 2 (two) times daily.  Dispense: 20 tablet; Refill: 0  2. Tinnitus, unspecified laterality Chronic.  Controlled.  Stable.  Patient with history of allergic rhinitis will refill fluticasone 1 puff each nostril once a day.- fluticasone (FLONASE) 50 MCG/ACT nasal spray; Place 2 sprays into both nostrils daily.  Dispense: 16 g; Refill: 6  3. Dysfunction of Eustachian tube, unspecified laterality Refill fluticasone. - fluticasone (FLONASE) 50 MCG/ACT nasal spray; Place 2 sprays into both nostrils daily.  Dispense: 16 g; Refill: 6   4. Colon cancer screening Patient last colonoscopy was approximately 10 years ago for which he saw Dr. .   Referral was made to gastroenterology for evaluation gastroenterology - Ambulatory referral to Gastroenterology  5. Influenza vaccine needed Discussed and administered - Flu Vaccine QUAD 6+ mos PF IM (Fluarix Quad PF)

## 2019-01-01 ENCOUNTER — Other Ambulatory Visit: Payer: Self-pay

## 2019-01-01 ENCOUNTER — Telehealth: Payer: Self-pay

## 2019-01-01 DIAGNOSIS — Z1211 Encounter for screening for malignant neoplasm of colon: Secondary | ICD-10-CM

## 2019-01-01 NOTE — Telephone Encounter (Signed)
Gastroenterology Pre-Procedure Review  Request Date: Monday 02/05/19 Requesting Physician: Dr. Vicente Males  PATIENT REVIEW QUESTIONS: The patient responded to the following health history questions as indicated:    1. Are you having any GI issues? yes (diverticulitis episode) 2. Do you have a personal history of Polyps? no 3. Do you have a family history of Colon Cancer or Polyps? no 4. Diabetes Mellitus? no 5. Joint replacements in the past 12 months?no 6. Major health problems in the past 3 months?no 7. Any artificial heart valves, MVP, or defibrillator?no    MEDICATIONS & ALLERGIES:    Patient reports the following regarding taking any anticoagulation/antiplatelet therapy:   Plavix, Coumadin, Eliquis, Xarelto, Lovenox, Pradaxa, Brilinta, or Effient? no Aspirin? yes (81 mg daily)  Patient confirms/reports the following medications:  Current Outpatient Medications  Medication Sig Dispense Refill  . amoxicillin-clavulanate (AUGMENTIN) 875-125 MG tablet Take 1 tablet by mouth 2 (two) times daily. 20 tablet 0  . aspirin 81 MG EC tablet Take 81 mg by mouth daily.    . cetirizine (ZYRTEC) 5 MG tablet Take 5 mg by mouth daily.    . famotidine (PEPCID) 40 MG tablet Take 1 tablet (40 mg total) by mouth daily. 90 tablet 1  . fluticasone (FLONASE) 50 MCG/ACT nasal spray Place 2 sprays into both nostrils daily. 16 g 6  . metroNIDAZOLE (FLAGYL) 500 MG tablet Take 1 tablet (500 mg total) by mouth 3 (three) times daily. 30 tablet 0  . tamsulosin (FLOMAX) 0.4 MG CAPS capsule Take 1 capsule (0.4 mg total) by mouth daily. 30 capsule 3   No current facility-administered medications for this visit.     Patient confirms/reports the following allergies:  No Known Allergies  No orders of the defined types were placed in this encounter.   AUTHORIZATION INFORMATION Primary Insurance: 1D#: Group #:  Secondary Insurance: 1D#: Group #:  SCHEDULE INFORMATION: Date: 02/05/19 Time: Location:[facility]

## 2019-01-02 ENCOUNTER — Ambulatory Visit (INDEPENDENT_AMBULATORY_CARE_PROVIDER_SITE_OTHER): Payer: Managed Care, Other (non HMO) | Admitting: Family Medicine

## 2019-01-02 ENCOUNTER — Encounter: Payer: Self-pay | Admitting: Family Medicine

## 2019-01-02 ENCOUNTER — Other Ambulatory Visit: Payer: Self-pay

## 2019-01-02 VITALS — BP 120/80 | HR 68 | Ht 67.0 in | Wt 198.0 lb

## 2019-01-02 DIAGNOSIS — Z0000 Encounter for general adult medical examination without abnormal findings: Secondary | ICD-10-CM

## 2019-01-02 DIAGNOSIS — Z6831 Body mass index (BMI) 31.0-31.9, adult: Secondary | ICD-10-CM | POA: Diagnosis not present

## 2019-01-02 DIAGNOSIS — Z23 Encounter for immunization: Secondary | ICD-10-CM

## 2019-01-02 DIAGNOSIS — I83813 Varicose veins of bilateral lower extremities with pain: Secondary | ICD-10-CM

## 2019-01-02 DIAGNOSIS — A630 Anogenital (venereal) warts: Secondary | ICD-10-CM

## 2019-01-02 DIAGNOSIS — R351 Nocturia: Secondary | ICD-10-CM

## 2019-01-02 NOTE — Progress Notes (Signed)
Date:  01/02/2019   Name:  Blake Phelps   DOB:  1960-12-02   MRN:  [id number]   Chief Complaint: Annual Exam  Patient is a 58 year old male who presents for a comprehensive physical exam. The patient reports the following problems: none. Health maintenance has been reviewed up to date.   Review of Systems  Constitutional: Negative for chills and fever.  HENT: Negative for drooling, ear discharge, ear pain, postnasal drip, rhinorrhea and sore throat.   Eyes: Negative for pain.  Respiratory: Negative for cough, chest tightness, shortness of breath and wheezing.   Cardiovascular: Negative for chest pain, palpitations and leg swelling.  Gastrointestinal: Negative for abdominal pain, blood in stool, constipation, diarrhea and nausea.  Endocrine: Negative for polydipsia.  Genitourinary: Negative for dysuria, frequency, hematuria and urgency.  Musculoskeletal: Negative for back pain, myalgias and neck pain.  Skin: Negative for rash.  Allergic/Immunologic: Negative for environmental allergies.  Neurological: Negative for dizziness, seizures and headaches.  Hematological: Does not bruise/bleed easily.  Psychiatric/Behavioral: Negative for suicidal ideas. The patient is not nervous/anxious.     Patient Active Problem List   Diagnosis Date Noted  . Hydronephrosis with renal and ureteral calculus obstruction 07/18/2014    No Known Allergies  Past Surgical History:  Procedure Laterality Date  . CARPAL TUNNEL RELEASE  1992  . COLONOSCOPY  2010   cleared for 10 yrs- Dr Bluford Kaufmann  . LITHOTRIPSY  07/2014    Social History   Tobacco Use  . Smoking status: Current Some Day Smoker    Types: Cigarettes  . Smokeless tobacco: Never Used  Substance Use Topics  . Alcohol use: No    Alcohol/week: 0.0 standard drinks  . Drug use: No     Medication list has been reviewed and updated.  Current Meds  Medication Sig  . amoxicillin-clavulanate (AUGMENTIN) 875-125 MG tablet Take 1 tablet by  mouth 2 (two) times daily.  Marland Kitchen aspirin 81 MG EC tablet Take 81 mg by mouth daily.  . cetirizine (ZYRTEC) 5 MG tablet Take 5 mg by mouth daily.  . famotidine (PEPCID) 40 MG tablet Take 1 tablet (40 mg total) by mouth daily.  . fluticasone (FLONASE) 50 MCG/ACT nasal spray Place 2 sprays into both nostrils daily.  . metroNIDAZOLE (FLAGYL) 500 MG tablet Take 1 tablet (500 mg total) by mouth 3 (three) times daily.  . [DISCONTINUED] tamsulosin (FLOMAX) 0.4 MG CAPS capsule Take 1 capsule (0.4 mg total) by mouth daily.    PHQ 2/9 Scores 12/01/2017 12/13/2016  PHQ - 2 Score 0 3  PHQ- 9 Score - 9    BP Readings from Last 3 Encounters:  01/02/19 120/80  12/29/18 130/80  06/26/18 128/74    Physical Exam Vitals signs and nursing note reviewed.  Constitutional:      Appearance: He is obese.  HENT:     Head: Normocephalic.     Jaw: There is normal jaw occlusion.     Right Ear: Hearing, tympanic membrane, ear canal and external ear normal.     Left Ear: Tympanic membrane, ear canal and external ear normal.     Nose: Nose normal.     Mouth/Throat:     Lips: Pink.     Mouth: Mucous membranes are moist.     Tongue: No lesions.     Palate: No mass.     Pharynx: Oropharynx is clear. Uvula midline.  Eyes:     General: Lids are normal. Vision grossly intact. Gaze aligned  appropriately. No scleral icterus.       Right eye: No discharge.        Left eye: No discharge.     Extraocular Movements: Extraocular movements intact.     Conjunctiva/sclera: Conjunctivae normal.     Pupils: Pupils are equal, round, and reactive to light.     Funduscopic exam:    Right eye: Red reflex present.        Left eye: No AV nicking. Red reflex present. Neck:     Musculoskeletal: Full passive range of motion without pain, normal range of motion and neck supple.     Thyroid: No thyroid mass, thyromegaly or thyroid tenderness.     Vascular: Normal carotid pulses. No carotid bruit, hepatojugular reflux or JVD.      Trachea: Trachea and phonation normal. No tracheal deviation.  Cardiovascular:     Rate and Rhythm: Normal rate and regular rhythm.  No extrasystoles are present.    Chest Wall: PMI is not displaced. No thrill.     Pulses: Normal pulses.          Carotid pulses are 2+ on the right side and 2+ on the left side.      Radial pulses are 2+ on the right side and 2+ on the left side.       Femoral pulses are 2+ on the right side and 2+ on the left side.      Popliteal pulses are 2+ on the right side and 2+ on the left side.       Dorsalis pedis pulses are 2+ on the right side and 2+ on the left side.       Posterior tibial pulses are 2+ on the right side and 2+ on the left side.     Heart sounds: Normal heart sounds. No murmur. No friction rub. No gallop.      Comments: Varicose veins lower extremeties Pulmonary:     Effort: Pulmonary effort is normal. No respiratory distress.     Breath sounds: Normal breath sounds. No decreased air movement. No decreased breath sounds, wheezing, rhonchi or rales.  Chest:     Chest wall: No mass.     Breasts:        Right: Normal. No swelling, bleeding, inverted nipple or mass.        Left: Normal. No swelling, bleeding, inverted nipple or mass.  Abdominal:     General: Abdomen is protuberant. Bowel sounds are normal.     Palpations: Abdomen is rigid. There is no hepatomegaly, splenomegaly or mass.     Tenderness: There is no abdominal tenderness. There is no guarding or rebound.     Hernia: There is no hernia in the left inguinal area or right inguinal area.  Genitourinary:    Pubic Area: No rash.      Penis: Normal. No tenderness.      Scrotum/Testes: Normal.        Right: Mass not present.        Left: Mass not present.     Epididymis:     Right: Normal.     Left: Normal.     Prostate: Normal. Not enlarged, not tender and no nodules present.     Rectum: Normal. Guaiac result negative. No mass or tenderness.     Comments: ? Perianal warts  Musculoskeletal: Normal range of motion.        General: No tenderness.     Cervical back: Normal.     Thoracic  back: Normal.     Lumbar back: Normal.     Right lower leg: No edema.     Left lower leg: No edema.  Lymphadenopathy:     Head:     Right side of head: No submental, submandibular or tonsillar adenopathy.     Left side of head: No submental, submandibular or tonsillar adenopathy.     Cervical: No cervical adenopathy.     Right cervical: No superficial, deep or posterior cervical adenopathy.    Left cervical: No superficial, deep or posterior cervical adenopathy.     Upper Body:     Right upper body: No supraclavicular, axillary or pectoral adenopathy.     Left upper body: No supraclavicular, axillary or pectoral adenopathy.     Lower Body: No right inguinal adenopathy. No left inguinal adenopathy.  Skin:    General: Skin is warm.     Capillary Refill: Capillary refill takes less than 2 seconds.     Coloration: Skin is not ashen.     Findings: No rash.  Neurological:     Mental Status: He is alert and oriented to person, place, and time.     Cranial Nerves: Cranial nerves are intact. No cranial nerve deficit.     Sensory: Sensation is intact.     Motor: Motor function is intact.     Deep Tendon Reflexes: Reflexes are normal and symmetric.  Psychiatric:        Attention and Perception: Attention and perception normal.        Mood and Affect: Mood and affect normal.        Speech: Speech normal.        Behavior: Behavior normal. Behavior is cooperative.        Cognition and Memory: Cognition normal. Abnormal recent memory: annual phy.     Wt Readings from Last 3 Encounters:  01/02/19 198 lb (89.8 kg)  12/29/18 199 lb (90.3 kg)  06/26/18 204 lb (92.5 kg)    BP 120/80   Pulse 68   Ht 5\' 7"  (1.702 m)   Wt 198 lb (89.8 kg)   BMI 31.01 kg/m   Assessment and Plan: 1. Annual physical exam No subjective/objective concerns noted during history and physical.   Previous encounters were reviewed previous labs were reviewed.Blake Phelps is a 58 y.o. male who presents today for his Complete Annual Exam. He feels well. He reports exercising . He reports he is sleeping well.  - Basic metabolic panel - CBC with Differential/Platelet - Lipid panel - PSA  2. Influenza vaccine needed Discussed and administered  3. Varicose veins of both lower extremities with pain Patient has bilateral varicose veins and spider veins noted there is some discomfort with the varicosities and we will refer to vein and vascular for evaluation and treatment if needed. - Ambulatory referral to Vascular Surgery  4. BMI 31.0-31.9,adult Health risks of being over weight were discussed and patient was counseled on weight loss options and exercise.  5. Condyloma acuminatum Patient was noted to have some firm areas around the perianal area and within the anal opening.  I am uncertain if these are warts or if these are sebaceous areas.  Will refer to dermatology for evaluation as well as evaluation of the alopecia/male pattern baldness. - Ambulatory referral to Dermatology  6. Nocturia Patient has some nocturia for which we will check PSA - PSA

## 2019-01-02 NOTE — Patient Instructions (Signed)

## 2019-01-03 LAB — BASIC METABOLIC PANEL
BUN/Creatinine Ratio: 10 (ref 10–24)
BUN: 10 mg/dL (ref 8–27)
CO2: 24 mmol/L (ref 20–29)
Calcium: 8.9 mg/dL (ref 8.6–10.2)
Chloride: 101 mmol/L (ref 96–106)
Creatinine, Ser: 1.03 mg/dL (ref 0.76–1.27)
GFR calc Af Amer: 91 mL/min/{1.73_m2} (ref >59)
GFR calc non Af Amer: 79 mL/min/{1.73_m2} (ref >59)
Glucose: 95 mg/dL (ref 65–99)
Potassium: 4.6 mmol/L (ref 3.5–5.2)
Sodium: 139 mmol/L (ref 134–144)

## 2019-01-03 LAB — LIPID PANEL
Chol/HDL Ratio: 3.9 ratio (ref 0.0–5.0)
Cholesterol, Total: 151 mg/dL (ref 100–199)
HDL: 39 mg/dL — ABNORMAL LOW (ref >39)
LDL Chol Calc (NIH): 92 mg/dL (ref 0–99)
Triglycerides: 109 mg/dL (ref 0–149)
VLDL Cholesterol Cal: 20 mg/dL (ref 5–40)

## 2019-01-03 LAB — CBC WITH DIFFERENTIAL/PLATELET
Basophils Absolute: 0 10*3/uL (ref 0.0–0.2)
Basos: 1 %
EOS (ABSOLUTE): 0.3 10*3/uL (ref 0.0–0.4)
Eos: 3 %
Hematocrit: 47.8 % (ref 37.5–51.0)
Hemoglobin: 15.8 g/dL (ref 13.0–17.7)
Immature Grans (Abs): 0 10*3/uL (ref 0.0–0.1)
Immature Granulocytes: 0 %
Lymphocytes Absolute: 2.4 10*3/uL (ref 0.7–3.1)
Lymphs: 29 %
MCH: 29.2 pg (ref 26.6–33.0)
MCHC: 33.1 g/dL (ref 31.5–35.7)
MCV: 88 fL (ref 79–97)
Monocytes Absolute: 0.8 10*3/uL (ref 0.1–0.9)
Monocytes: 10 %
Neutrophils Absolute: 4.6 10*3/uL (ref 1.4–7.0)
Neutrophils: 57 %
Platelets: 205 10*3/uL (ref 150–450)
RBC: 5.42 x10E6/uL (ref 4.14–5.80)
RDW: 13.1 % (ref 11.6–15.4)
WBC: 8.2 10*3/uL (ref 3.4–10.8)

## 2019-01-03 LAB — PSA: Prostate Specific Ag, Serum: 1.4 ng/mL (ref 0.0–4.0)

## 2019-01-22 ENCOUNTER — Other Ambulatory Visit (INDEPENDENT_AMBULATORY_CARE_PROVIDER_SITE_OTHER): Payer: Self-pay | Admitting: Vascular Surgery

## 2019-01-22 DIAGNOSIS — I83813 Varicose veins of bilateral lower extremities with pain: Secondary | ICD-10-CM

## 2019-01-24 ENCOUNTER — Encounter (INDEPENDENT_AMBULATORY_CARE_PROVIDER_SITE_OTHER): Payer: Managed Care, Other (non HMO) | Admitting: Nurse Practitioner

## 2019-01-24 ENCOUNTER — Encounter (INDEPENDENT_AMBULATORY_CARE_PROVIDER_SITE_OTHER): Payer: Managed Care, Other (non HMO)

## 2019-02-01 ENCOUNTER — Other Ambulatory Visit: Payer: Self-pay

## 2019-02-01 ENCOUNTER — Other Ambulatory Visit
Admission: RE | Admit: 2019-02-01 | Discharge: 2019-02-01 | Disposition: A | Discharge status: Home / Self Care | Payer: Managed Care, Other (non HMO) | Source: Ambulatory Visit | Attending: Gastroenterology | Admitting: Gastroenterology

## 2019-02-01 DIAGNOSIS — Z20828 Contact with and (suspected) exposure to other viral communicable diseases: Secondary | ICD-10-CM | POA: Insufficient documentation

## 2019-02-01 DIAGNOSIS — Z01812 Encounter for preprocedural laboratory examination: Secondary | ICD-10-CM | POA: Diagnosis present

## 2019-02-01 LAB — SARS CORONAVIRUS 2 (TAT 6-24 HRS): SARS Coronavirus 2: NEGATIVE

## 2019-02-05 ENCOUNTER — Ambulatory Visit: Payer: Managed Care, Other (non HMO) | Admitting: Anesthesiology

## 2019-02-05 ENCOUNTER — Ambulatory Visit
Admission: RE | Admit: 2019-02-05 | Discharge: 2019-02-05 | Disposition: A | Discharge status: Home / Self Care | Payer: Managed Care, Other (non HMO) | Attending: Gastroenterology | Admitting: Gastroenterology

## 2019-02-05 ENCOUNTER — Encounter
Admission: RE | Disposition: A | Discharge status: Home / Self Care | Payer: Self-pay | Source: Home / Self Care | Attending: Gastroenterology

## 2019-02-05 ENCOUNTER — Encounter: Payer: Self-pay | Admitting: *Deleted

## 2019-02-05 ENCOUNTER — Other Ambulatory Visit: Payer: Self-pay

## 2019-02-05 DIAGNOSIS — Z1211 Encounter for screening for malignant neoplasm of colon: Secondary | ICD-10-CM | POA: Diagnosis present

## 2019-02-05 DIAGNOSIS — K219 Gastro-esophageal reflux disease without esophagitis: Secondary | ICD-10-CM | POA: Insufficient documentation

## 2019-02-05 DIAGNOSIS — K640 First degree hemorrhoids: Secondary | ICD-10-CM | POA: Insufficient documentation

## 2019-02-05 DIAGNOSIS — K5730 Diverticulosis of large intestine without perforation or abscess without bleeding: Secondary | ICD-10-CM | POA: Diagnosis not present

## 2019-02-05 DIAGNOSIS — F17210 Nicotine dependence, cigarettes, uncomplicated: Secondary | ICD-10-CM | POA: Insufficient documentation

## 2019-02-05 DIAGNOSIS — Z7982 Long term (current) use of aspirin: Secondary | ICD-10-CM | POA: Diagnosis not present

## 2019-02-05 HISTORY — PX: COLONOSCOPY WITH PROPOFOL: SHX5780

## 2019-02-05 SURGERY — COLONOSCOPY WITH PROPOFOL
Anesthesia: General

## 2019-02-05 MED ORDER — PROPOFOL 10 MG/ML IV BOLUS
INTRAVENOUS | Status: DC | PRN
  Administered 2019-02-05: 50 mg via INTRAVENOUS

## 2019-02-05 MED ORDER — PROPOFOL 500 MG/50ML IV EMUL
INTRAVENOUS | Status: DC | PRN
  Administered 2019-02-05: 100 ug/kg/min via INTRAVENOUS

## 2019-02-05 MED ORDER — SODIUM CHLORIDE 0.9 % IV SOLN
INTRAVENOUS | Status: DC
  Administered 2019-02-05: 1000 mL via INTRAVENOUS

## 2019-02-05 MED ORDER — SODIUM CHLORIDE 0.9 % IV SOLN: INTRAVENOUS | Status: DC | PRN

## 2019-02-05 MED ORDER — PROPOFOL 500 MG/50ML IV EMUL
INTRAVENOUS | Status: AC
  Filled 2019-02-05: qty 50

## 2019-02-05 NOTE — Anesthesia Preprocedure Evaluation (Signed)
Anesthesia Evaluation  Patient identified by MRN, date of birth, ID band Patient awake    Reviewed: Allergy & Precautions, H&P , NPO status , Patient's Chart, lab work & pertinent test results, reviewed documented beta blocker date and time   History of Anesthesia Complications Negative for: history of anesthetic complications  Airway Mallampati: II  TM Distance: >3 FB Neck ROM: full    Dental  (+) Dental Advidsory Given   Pulmonary neg shortness of breath, neg COPD, neg recent URI, Current Smoker,    Pulmonary exam normal        Cardiovascular Exercise Tolerance: Good negative cardio ROS Normal cardiovascular exam     Neuro/Psych negative neurological ROS  negative psych ROS   GI/Hepatic Neg liver ROS, GERD  ,  Endo/Other  negative endocrine ROS  Renal/GU Renal disease (kidney stone)  negative genitourinary   Musculoskeletal   Abdominal   Peds  Hematology negative hematology ROS (+)   Anesthesia Other Findings Past Medical History: No date: Allergy No date: GERD (gastroesophageal reflux disease)   Reproductive/Obstetrics negative OB ROS                             Anesthesia Physical Anesthesia Plan  ASA: II  Anesthesia Plan: General   Post-op Pain Management:    Induction: Intravenous  PONV Risk Score and Plan: 1 and Propofol infusion and TIVA  Airway Management Planned: Natural Airway and Nasal Cannula  Additional Equipment:   Intra-op Plan:   Post-operative Plan:   Informed Consent: I have reviewed the patients History and Physical, chart, labs and discussed the procedure including the risks, benefits and alternatives for the proposed anesthesia with the patient or authorized representative who has indicated his/her understanding and acceptance.     Dental Advisory Given  Plan Discussed with: Anesthesiologist, CRNA and Surgeon  Anesthesia Plan Comments:          Anesthesia Quick Evaluation

## 2019-02-05 NOTE — Transfer of Care (Addendum)
Immediate Anesthesia Transfer of Care Note  Patient: Blake Phelps  Procedure(s) Performed: COLONOSCOPY WITH PROPOFOL (N/A )  Patient Location: PACU and Endoscopy Unit  Anesthesia Type:General  Level of Consciousness: awake, alert  and oriented  Airway & Oxygen Therapy: Patient Spontanous Breathing  Post-op Assessment: Report given to RN and Post -op Vital signs reviewed and stable  Post vital signs: Reviewed and stable  Last Vitals:  Vitals Value Taken Time  BP    Temp    Pulse    Resp    SpO2      Last Pain:  Vitals:   02/05/19 0902  TempSrc:   PainSc: 0-No pain         Complications: No apparent anesthesia complications

## 2019-02-05 NOTE — Anesthesia Post-op Follow-up Note (Signed)
Anesthesia QCDR form completed.        

## 2019-02-05 NOTE — H&P (Signed)
Blake Bellows, MD [address] [place], [place], [place], [id number], [place], [place], [place], [address] Phone: [phone]  Fax: [phone]  Primary Care Physician:  Juline Patch, MD   Pre-Procedure History & Physical: HPI:  Blake Phelps is a 58 y.o. male is here for an colonoscopy.   Past Medical History:  Diagnosis Date  . Allergy   . GERD (gastroesophageal reflux disease)     Past Surgical History:  Procedure Laterality Date  . CARPAL TUNNEL RELEASE  1992  . COLONOSCOPY  2010   cleared for 10 yrs- Dr Candace Cruise  . LITHOTRIPSY  07/2014    Prior to Admission medications   Medication Sig Start Date End Date Taking? Authorizing Provider  aspirin 81 MG EC tablet Take 81 mg by mouth daily.    [provider]  cetirizine (ZYRTEC) 5 MG tablet Take 5 mg by mouth daily.    [provider]  famotidine (PEPCID) 40 MG tablet Take 1 tablet (40 mg total) by mouth daily. 08/28/18   Juline Patch, MD  fluticasone (FLONASE) 50 MCG/ACT nasal spray Place 2 sprays into both nostrils daily. 12/29/18   Juline Patch, MD    Allergies as of 01/01/2019  . (No Known Allergies)    Family History  Problem Relation Age of Onset  . Cancer Mother   . Heart disease Mother   . Cancer Father     Social History   Socioeconomic History  . Marital status: Married    Spouse name: Not on file  . Number of children: Not on file  . Years of education: Not on file  . Highest education level: Not on file  Occupational History  . Not on file  Social Needs  . Financial resource strain: Not on file  . Food insecurity    Worry: Not on file    Inability: Not on file  . Transportation needs    Medical: Not on file    Non-medical: Not on file  Tobacco Use  . Smoking status: Current Some Day Smoker    Types: Cigarettes  . Smokeless tobacco: Never Used  Substance and Sexual Activity  . Alcohol use: No    Alcohol/week: 0.0 standard drinks  . Drug use: No   . Sexual activity: Never  Lifestyle  . Physical activity    Days per week: Not on file    Minutes per session: Not on file  . Stress: Not on file  Relationships  . Social Herbalist on phone: Not on file    Gets together: Not on file    Attends religious service: Not on file    Active member of club or organization: Not on file    Attends meetings of clubs or organizations: Not on file    Relationship status: Not on file  . Intimate partner violence    Fear of current or ex partner: Not on file    Emotionally abused: Not on file    Physically abused: Not on file    Forced sexual activity: Not on file  Other Topics Concern  . Not on file  Social History Narrative  . Not on file    Review of Systems: See HPI, otherwise negative ROS  Physical Exam: BP (!) 162/82   Pulse (!) 40   Temp (!) 96.6 F (35.9 C) (Skin)   Resp 18   Ht 5\' 7"  (1.702 m)   Wt 87.1 kg  SpO2 98%   BMI 30.07 kg/m  General:   Alert,  pleasant and cooperative in NAD Head:  Normocephalic and atraumatic. Neck:  Supple; no masses or thyromegaly. Lungs:  Clear throughout to auscultation, normal respiratory effort.    Heart:  +S1, +S2, Regular rate and rhythm, No edema. Abdomen:  Soft, nontender and nondistended. Normal bowel sounds, without guarding, and without rebound.   Neurologic:  Alert and  oriented x4;  grossly normal neurologically.  Impression/Plan: Blake Phelps is here for an colonoscopy to be performed for Screening colonoscopy average risk   Risks, benefits, limitations, and alternatives regarding  colonoscopy have been reviewed with the patient.  Questions have been answered.  All parties agreeable.   Wyline Mood, MD  02/05/2019, 8:10 AM

## 2019-02-05 NOTE — Op Note (Signed)
[facility] Gastroenterology Patient Name: Blake AlbeeLuis Seyer Procedure Date: 02/05/2019 8:09 AM MRN: [id number] Account #: [id number] Date of Birth: 28-Dec-1960 Admit Type: Outpatient Age: 58 [facility] Gender: Male Note Status: Finalized Procedure:             Colonoscopy Indications:           Screening for colorectal malignant neoplasm Providers:             Wyline MoodKiran Camil Hausmann MD, MD Referring MD:          Duanne Limerickeanna C. Jones, MD (Referring MD) Medicines:             Monitored Anesthesia Care Complications:         No immediate complications. Procedure:             Pre-Anesthesia Assessment:                        - Prior to the procedure, a History and Physical was                         performed, and patient medications, allergies and                         sensitivities were reviewed. The patient's tolerance                         of previous anesthesia was reviewed.                        - The risks and benefits of the procedure and the                         sedation options and risks were discussed with the                         patient. All questions were answered and informed                         consent was obtained.                        - ASA Grade Assessment: II - A patient with mild                         systemic disease.                        After obtaining informed consent, the colonoscope was                         passed under direct vision. Throughout the procedure,                         the patient's blood pressure, pulse, and oxygen                         saturations were monitored continuously. The                         Colonoscope was introduced through the anus  and                         advanced to the the cecum, identified by the                         appendiceal orifice. The colonoscopy was performed                         with ease. The patient tolerated the procedure well.                         The quality of  the bowel preparation was excellent. Findings:      The perianal and digital rectal examinations were normal.      Multiple small-mouthed diverticula were found in the sigmoid colon.      Non-bleeding internal hemorrhoids were found during retroflexion. The       hemorrhoids were medium-sized and Grade I (internal hemorrhoids that do       not prolapse).      The exam was otherwise without abnormality on direct and retroflexion       views. Impression:            - Diverticulosis in the sigmoid colon.                        - Non-bleeding internal hemorrhoids.                        - The examination was otherwise normal on direct and                         retroflexion views.                        - No specimens collected. Recommendation:        - Discharge patient to home (with escort).                        - Resume previous diet.                        - Continue present medications.                        - Await pathology results.                        - Repeat colonoscopy in 10 years for screening                         purposes. Procedure Code(s):     --- Professional ---                        [phone], Colonoscopy, flexible; diagnostic, including                         collection of specimen(s) by brushing or washing, when                         performed (separate procedure) Diagnosis Code(s):     ---  Professional ---                        Z12.11, Encounter for screening for malignant neoplasm                         of colon                        K64.0, First degree hemorrhoids                        K57.30, Diverticulosis of large intestine without                         perforation or abscess without bleeding CPT copyright 2019 [employer]. All rights reserved. The codes documented in this report are preliminary and upon coder review may  be revised to meet current compliance requirements. Wyline Mood, MD Wyline Mood MD, MD 02/05/2019 8:29:58  AM This report has been signed electronically. Number of Addenda: 0 Note Initiated On: 02/05/2019 8:09 AM Scope Withdrawal Time: 0 hours 9 minutes 32 seconds  Total Procedure Duration: 0 hours 11 minutes 26 seconds  Estimated Blood Loss:  Estimated blood loss: none.      [facility]

## 2019-02-06 NOTE — Anesthesia Postprocedure Evaluation (Signed)
Anesthesia Post Note  Patient: Jaymz A Viereck  Procedure(s) Performed: COLONOSCOPY WITH PROPOFOL (N/A )  Patient location during evaluation: Endoscopy Anesthesia Type: General Level of consciousness: awake and alert Pain management: pain level controlled Vital Signs Assessment: post-procedure vital signs reviewed and stable Respiratory status: spontaneous breathing, nonlabored ventilation, respiratory function stable and patient connected to nasal cannula oxygen Cardiovascular status: blood pressure returned to baseline and stable Postop Assessment: no apparent nausea or vomiting Anesthetic complications: no     Last Vitals:  Vitals:   02/05/19 0902 02/05/19 0922  BP: (!) 151/82 (!) 153/108  Pulse: (!) 57 90  Resp: 14 15  Temp:    SpO2: 100% 99%    Last Pain:  Vitals:   02/06/19 0716  TempSrc:   PainSc: 0-No pain                 Martha Clan

## 2019-02-07 ENCOUNTER — Other Ambulatory Visit: Payer: Self-pay

## 2019-02-07 DIAGNOSIS — K219 Gastro-esophageal reflux disease without esophagitis: Secondary | ICD-10-CM

## 2019-02-07 MED ORDER — FAMOTIDINE 40 MG PO TABS: 40.0000 mg | ORAL_TABLET | Freq: Every day | ORAL | 0 refills | Status: DC

## 2019-05-01 ENCOUNTER — Encounter: Payer: Self-pay | Admitting: Emergency Medicine

## 2019-05-01 ENCOUNTER — Other Ambulatory Visit: Payer: Self-pay

## 2019-05-01 ENCOUNTER — Emergency Department: Payer: Managed Care, Other (non HMO)

## 2019-05-01 ENCOUNTER — Emergency Department
Admission: EM | Admit: 2019-05-01 | Discharge: 2019-05-01 | Disposition: A | Discharge status: Home / Self Care | Payer: Managed Care, Other (non HMO) | Attending: Emergency Medicine | Admitting: Emergency Medicine

## 2019-05-01 DIAGNOSIS — K5730 Diverticulosis of large intestine without perforation or abscess without bleeding: Secondary | ICD-10-CM | POA: Insufficient documentation

## 2019-05-01 DIAGNOSIS — Z79899 Other long term (current) drug therapy: Secondary | ICD-10-CM | POA: Insufficient documentation

## 2019-05-01 DIAGNOSIS — F17210 Nicotine dependence, cigarettes, uncomplicated: Secondary | ICD-10-CM | POA: Insufficient documentation

## 2019-05-01 DIAGNOSIS — N132 Hydronephrosis with renal and ureteral calculous obstruction: Secondary | ICD-10-CM | POA: Insufficient documentation

## 2019-05-01 DIAGNOSIS — Z7982 Long term (current) use of aspirin: Secondary | ICD-10-CM | POA: Diagnosis not present

## 2019-05-01 DIAGNOSIS — N200 Calculus of kidney: Secondary | ICD-10-CM

## 2019-05-01 DIAGNOSIS — R1030 Lower abdominal pain, unspecified: Secondary | ICD-10-CM | POA: Diagnosis present

## 2019-05-01 LAB — CBC
HCT: 47.4 % (ref 39.0–52.0)
Hemoglobin: 15.8 g/dL (ref 13.0–17.0)
MCH: 29.3 pg (ref 26.0–34.0)
MCHC: 33.3 g/dL (ref 30.0–36.0)
MCV: 87.8 fL (ref 80.0–100.0)
Platelets: 232 10*3/uL (ref 150–400)
RBC: 5.4 MIL/uL (ref 4.22–5.81)
RDW: 12.8 % (ref 11.5–15.5)
WBC: 10.8 10*3/uL — ABNORMAL HIGH (ref 4.0–10.5)
nRBC: 0 % (ref 0.0–0.2)

## 2019-05-01 LAB — URINALYSIS, COMPLETE (UACMP) WITH MICROSCOPIC
Bacteria, UA: NONE SEEN
Bilirubin Urine: NEGATIVE
Glucose, UA: NEGATIVE mg/dL
Ketones, ur: NEGATIVE mg/dL
Leukocytes,Ua: NEGATIVE
Nitrite: NEGATIVE
Protein, ur: 100 mg/dL — AB
RBC / HPF: >50 RBC/hpf — ABNORMAL HIGH (ref 0–5)
Specific Gravity, Urine: 1.024 (ref 1.005–1.030)
pH: 5 (ref 5.0–8.0)

## 2019-05-01 LAB — BASIC METABOLIC PANEL
Anion gap: 9 (ref 5–15)
BUN: 13 mg/dL (ref 6–20)
CO2: 25 mmol/L (ref 22–32)
Calcium: 8.9 mg/dL (ref 8.9–10.3)
Chloride: 104 mmol/L (ref 98–111)
Creatinine, Ser: 1.19 mg/dL (ref 0.61–1.24)
GFR calc Af Amer: >60 mL/min (ref >60)
GFR calc non Af Amer: >60 mL/min (ref >60)
Glucose, Bld: 125 mg/dL — ABNORMAL HIGH (ref 70–99)
Potassium: 4.3 mmol/L (ref 3.5–5.1)
Sodium: 138 mmol/L (ref 135–145)

## 2019-05-01 MED ORDER — ONDANSETRON 4 MG PO TBDP: 4.0000 mg | ORAL_TABLET | Freq: Three times a day (TID) | ORAL | 0 refills | Status: DC | PRN

## 2019-05-01 MED ORDER — OXYCODONE HCL 5 MG PO TABS: 5.0000 mg | ORAL_TABLET | ORAL | 0 refills | Status: AC | PRN

## 2019-05-01 MED ORDER — TAMSULOSIN HCL 0.4 MG PO CAPS: 0.4000 mg | ORAL_CAPSULE | Freq: Every day | ORAL | 0 refills | Status: AC

## 2019-05-01 MED ORDER — SODIUM CHLORIDE 0.9 % IV BOLUS
1000.0000 mL | Freq: Once | INTRAVENOUS | Status: AC
  Administered 2019-05-01: 1000 mL via INTRAVENOUS

## 2019-05-01 MED ORDER — ONDANSETRON HCL 4 MG/2ML IJ SOLN
4.0000 mg | Freq: Once | INTRAMUSCULAR | Status: AC
  Administered 2019-05-01: 4 mg via INTRAVENOUS
  Filled 2019-05-01: qty 2

## 2019-05-01 MED ORDER — HYDROMORPHONE HCL 1 MG/ML IJ SOLN
1.0000 mg | Freq: Once | INTRAMUSCULAR | Status: AC
  Administered 2019-05-01: 1 mg via INTRAVENOUS
  Filled 2019-05-01: qty 1

## 2019-05-01 NOTE — ED Provider Notes (Signed)
[facility] Emergency Department Provider Note  ____________________________________________   First MD Initiated Contact with Patient 05/01/19 1657     (approximate)  I have reviewed the triage vital signs and the nursing notes.   HISTORY  Chief Complaint Flank Pain and Abdominal Pain    HPI Blake Phelps is a 59 y.o. male with history of kidney stones requiring lithotripsy who comes in with right flank pain that radiates into his groin.  He states the pain started today, constant, nothing makes it better, nothing makes it worse.  Has been associate with some nonbloody nonbilious vomiting.  States that he just feels kind of bloated.  Denies any abdominal pain in the front of his abdomen.  States he had a prior kidney stone that they had to go up and retrieved.          Past Medical History:  Diagnosis Date  . Allergy   . GERD (gastroesophageal reflux disease)     Patient Active Problem List   Diagnosis Date Noted  . Hydronephrosis with renal and ureteral calculus obstruction 07/18/2014    Past Surgical History:  Procedure Laterality Date  . CARPAL TUNNEL RELEASE  1992  . COLONOSCOPY  2010   cleared for 10 yrs- Dr Bluford Kaufmann  . COLONOSCOPY WITH PROPOFOL N/A 02/05/2019   Procedure: COLONOSCOPY WITH PROPOFOL;  Surgeon: Wyline Mood, MD;  Location: [facility] ENDOSCOPY;  Service: Gastroenterology;  Laterality: N/A;  . LITHOTRIPSY  07/2014    Prior to Admission medications   Medication Sig Start Date End Date Taking? Authorizing Provider  aspirin 81 MG EC tablet Take 81 mg by mouth daily.    [provider]  cetirizine (ZYRTEC) 5 MG tablet Take 5 mg by mouth daily.    [provider]  famotidine (PEPCID) 40 MG tablet Take 1 tablet (40 mg total) by mouth daily. 02/07/19   Duanne Limerick, MD  fluticasone (FLONASE) 50 MCG/ACT nasal spray Place 2 sprays into both nostrils daily. 12/29/18   Duanne Limerick, MD    Allergies Patient has no  known allergies.  Family History  Problem Relation Age of Onset  . Cancer Mother   . Heart disease Mother   . Cancer Father     Social History Social History   Tobacco Use  . Smoking status: Current Some Day Smoker    Types: Cigarettes  . Smokeless tobacco: Never Used  Substance Use Topics  . Alcohol use: No    Alcohol/week: 0.0 standard drinks  . Drug use: No      Review of Systems Constitutional: No fever/chills Eyes: No visual changes. ENT: No sore throat. Cardiovascular: Denies chest pain. Respiratory: Denies shortness of breath. Gastrointestinal: No abdominal pain.  No nausea, no vomiting.  No diarrhea.  No constipation.  Positive right flank pain   Genitourinary: Negative for dysuria. Musculoskeletal: Negative for back pain. Skin: Negative for rash. Neurological: Negative for headaches, focal weakness or numbness. All other ROS negative ____________________________________________   PHYSICAL EXAM:  VITAL SIGNS: ED Triage Vitals  Enc Vitals Group     BP 05/01/19 1524 (!) 205/88     Pulse Rate 05/01/19 1522 (!) 56     Resp 05/01/19 1522 18     Temp 05/01/19 1522 98.6 F (37 C)     Temp Source 05/01/19 1522 Oral     SpO2 05/01/19 1524 96 %     Weight 05/01/19 1523 198 lb (89.8 kg)     Height 05/01/19 1523 5'  7" (1.702 m)     Head Circumference --      Peak Flow --      Pain Score 05/01/19 1530 5     Pain Loc --      Pain Edu? --      Excl. in GC? --     Constitutional: Alert and oriented. Well appearing and in no acute distress. Eyes: Conjunctivae are normal. EOMI. Head: Atraumatic. Nose: No congestion/rhinnorhea. Mouth/Throat: Mucous membranes are moist.   Neck: No stridor. Trachea Midline. FROM Cardiovascular: Normal rate, regular rhythm. Grossly normal heart sounds.  Good peripheral circulation. Respiratory: Normal respiratory effort.  No retractions. Lungs CTAB. Gastrointestinal: Soft and nontender. No distention. No abdominal bruits.    Musculoskeletal: No lower extremity tenderness nor edema.  No joint effusions. Neurologic:  Normal speech and language. No gross focal neurologic deficits are appreciated.  Skin:  Skin is warm, dry and intact. No rash noted. Psychiatric: Mood and affect are normal. Speech and behavior are normal. GU: Normal testicles bilaterally without any evidence of tenderness.  ____________________________________________   LABS (all labs ordered are listed, but only abnormal results are displayed)  Labs Reviewed  URINALYSIS, COMPLETE (UACMP) WITH MICROSCOPIC - Abnormal; Notable for the following components:      Result Value   Color, Urine YELLOW (*)    APPearance HAZY (*)    Hgb urine dipstick LARGE (*)    Protein, ur 100 (*)    RBC / HPF >50 (*)    All other components within normal limits  CBC - Abnormal; Notable for the following components:   WBC 10.8 (*)    All other components within normal limits  BASIC METABOLIC PANEL - Abnormal; Notable for the following components:   Glucose, Bld 125 (*)    All other components within normal limits   ____________________________________________ RADIOLOGY   Official radiology report(s): CT Renal Stone Study  Result Date: 05/01/2019 CLINICAL DATA:  Lower abdominal and right flank pain. History of kidney stone. EXAM: CT ABDOMEN AND PELVIS WITHOUT CONTRAST TECHNIQUE: Multidetector CT imaging of the abdomen and pelvis was performed following the standard protocol without IV contrast. COMPARISON:  CT 06/29/2018 FINDINGS: Lower chest: Lung bases are clear. No focal airspace disease or pleural fluid. Coronary artery calcifications. Hepatobiliary: No focal liver abnormality is seen. Mild hepatic steatosis. No gallstones, gallbladder wall thickening, or biliary dilatation. Pancreas: No ductal dilatation or inflammation. Spleen: Normal in size without focal abnormality. Adrenals/Urinary Tract: Normal adrenal glands. Obstructing 4 x 5 mm stone in the distal  right ureter approximately 2 cm proximal to the ureterovesicular junction with moderate right hydroureteronephrosis. There is moderate right perinephric and periureteric soft tissue stranding. At least 4 nonobstructing stones in the right kidney. Simple cyst in the upper right kidney again seen. No left hydronephrosis or hydroureter. Nonobstructing stones in the lower left kidney. Urinary bladder is minimally distended. No bladder stone or wall thickening. Stomach/Bowel: Tiny hiatal hernia. No bowel obstruction or inflammation. Normal appendix. Distal colonic diverticulosis without diverticulitis. Vascular/Lymphatic: Aorta bi-iliac atherosclerosis. No aortic aneurysm. No enlarged lymph nodes in the abdomen or pelvis. Reproductive: Prominent prostate gland spanning 5.2 cm with central prostatic calcifications. Other: Tiny fat containing umbilical hernia. No free air, free fluid, or intra-abdominal fluid collection. Musculoskeletal: There are no acute or suspicious osseous abnormalities. Small sclerotic density within L2 is unchanged and likely a bone island. IMPRESSION: 1. Obstructing 4 x 5 mm stone in the distal right ureter with moderate right hydroureteronephrosis. 2. Additional nonobstructing stones in  both kidneys. 3. Incidental findings of colonic diverticulosis. Hepatic steatosis. Aortic Atherosclerosis (ICD10-I70.0). Electronically Signed   By: Narda Rutherford M.D.   On: 05/01/2019 17:24    ____________________________________________   PROCEDURES  Procedure(s) performed (including Critical Care):  Procedures   ____________________________________________   INITIAL IMPRESSION / ASSESSMENT AND PLAN / ED COURSE  Blake Phelps was evaluated in Emergency Department on 05/01/2019 for the symptoms described in the history of present illness. He was evaluated in the context of the global COVID-19 pandemic, which necessitated consideration that the patient might be at risk for infection with the  SARS-CoV-2 virus that causes COVID-19. Institutional protocols and algorithms that pertain to the evaluation of patients at risk for COVID-19 are in a state of rapid change based on information released by regulatory bodies including the CDC and federal and state organizations. These policies and algorithms were followed during the patient's care in the ED.    Patient presents with what sounds like kidney stone.  Will get CT scan to further evaluate.  Unlikely to be septic at this time given afebrile.  Hypertension is most likely secondary to pain.  Denies having a history of hypertension previously.  No chest pain to suggest dissection.  No abdominal pain to suggest AAA, diverticulitis, perforation.  UA has greater than 50 RBCs in it which makes me think that this is most likely a kidney stone.  UA without evidence of infection.  White count is slightly elevated 10.8.  No evidence of anemia.  Kidney function is normal.  CT scan does show a right-sided 4 x 5 stone with moderate hydro.  This is consistent with his right flank pain radiating into his groin.  Reevaluated patient and his pain is now relieved.  Patient not having any nausea or vomiting since being in the ER.  Patient has a urologist that he can follow-up with.  I discussed with patient that there is a chance that he may need to have another procedure to have this removed versus passing on his own.  He states he is hoping he can try to pass on his own.  Discussed not using oxycodone and driving.  We discussed Zofran and tamsulosin.  We also gave our urologist number if he cannot get into his.  At this time there is no evidence of septic stone.  I discussed the provisional nature of ED diagnosis, the treatment so far, the ongoing plan of care, follow up appointments and return precautions with the patient and any family or support people present. They expressed understanding and agreed with the plan, discharged  home.  ____________________________________________   FINAL CLINICAL IMPRESSION(S) / ED DIAGNOSES   Final diagnoses:  Kidney stone      MEDICATIONS GIVEN DURING THIS VISIT:  Medications  HYDROmorphone (DILAUDID) injection 1 mg (has no administration in time range)  ondansetron (ZOFRAN) injection 4 mg (has no administration in time range)  sodium chloride 0.9 % bolus 1,000 mL (has no administration in time range)     ED Discharge Orders         Ordered    oxyCODONE (ROXICODONE) 5 MG immediate release tablet  Every 4 hours PRN     05/01/19 1837    ondansetron (ZOFRAN ODT) 4 MG disintegrating tablet  Every 8 hours PRN     05/01/19 1837    tamsulosin (FLOMAX) 0.4 MG CAPS capsule  Daily     05/01/19 1837           Note:  This  document was prepared using Systems analyst and may include unintentional dictation errors.   Vanessa Seaford, MD 05/01/19 [phone]

## 2019-05-01 NOTE — ED Triage Notes (Signed)
Patient reports lower abdominal pain and right flank pain with nausea and bloating that started this morning. Reports history of kidney stones and diverticulitis. Denies vomiting or diarrhea.

## 2019-05-01 NOTE — Discharge Instructions (Signed)
Take the Zofran for nausea, oxycodone for pain, tamsulosin to dilate your urethra.  You should call your urologist tomorrow and get an appointment in the next few days.  Return to the ER if you develop fevers, continued vomiting or any other concerns   IMPRESSION: 1. Obstructing 4 x 5 mm stone in the distal right ureter with moderate right hydroureteronephrosis. 2. Additional nonobstructing stones in both kidneys. 3. Incidental findings of colonic diverticulosis. Hepatic steatosis.

## 2019-08-28 ENCOUNTER — Other Ambulatory Visit: Payer: Self-pay

## 2019-08-28 ENCOUNTER — Encounter: Payer: Self-pay | Admitting: Family Medicine

## 2019-08-28 ENCOUNTER — Ambulatory Visit: Payer: Managed Care, Other (non HMO) | Admitting: Family Medicine

## 2019-08-28 VITALS — BP 140/60 | HR 80 | Ht 67.0 in | Wt 205.0 lb

## 2019-08-28 DIAGNOSIS — H9319 Tinnitus, unspecified ear: Secondary | ICD-10-CM | POA: Diagnosis not present

## 2019-08-28 NOTE — Patient Instructions (Addendum)
Tinnitus Tinnitus refers to hearing a sound when there is no actual source for that sound. This is often described as ringing in the ears. However, people with this condition may hear a variety of noises, in one ear or in both ears. The sounds of tinnitus can be soft, loud, or somewhere in between. Tinnitus can last for a few seconds or can be constant for days. It may go away without treatment and come back at various times. When tinnitus is constant or happens often, it can lead to other problems, such as trouble sleeping and trouble concentrating. Almost everyone experiences tinnitus at some point. Tinnitus that is long-lasting (chronic) or comes back often (recurs) may require medical attention. What are the causes? The cause of tinnitus is often not known. In some cases, it can result from:  Exposure to loud noises from machinery, music, or other sources.  An object (foreign body) stuck in the ear.  Earwax buildup.  Drinking alcohol or caffeine.  Taking certain medicines.  Age-related hearing loss. It may also be caused by medical conditions such as:  Ear or sinus infections.  High blood pressure.  Heart diseases.  Anemia.  Allergies.  Meniere's disease.  Thyroid problems.  Tumors.  A weak, bulging blood vessel (aneurysm) near the ear. What are the signs or symptoms? The main symptom of tinnitus is hearing a sound when there is no source for that sound. It may sound like:  Buzzing.  Roaring.  Ringing.  Blowing air.  Hissing.  Whistling.  Sizzling.  Humming.  Running water.  A musical note.  Tapping. Symptoms may affect only one ear (unilateral) or both ears (bilateral). How is this diagnosed? Tinnitus is diagnosed based on your symptoms, your medical history, and a physical exam. Your health care provider may do a thorough hearing test (audiologic exam) if your tinnitus:  Is unilateral.  Causes hearing difficulties.  Lasts 6 months or  longer. You may work with a health care provider who specializes in hearing disorders (audiologist). You may be asked questions about your symptoms and how they affect your daily life. You may have other tests done, such as:  CT scan.  MRI.  An imaging test of how blood flows through your blood vessels (angiogram). How is this treated? Treating an underlying medical condition can sometimes make tinnitus go away. If your tinnitus continues, other treatments may include:  Medicines.  Therapy and counseling to help you manage the stress of living with tinnitus.  Sound generators to mask the tinnitus. These include: ? Tabletop sound machines that play relaxing sounds to help you fall asleep. ? Wearable devices that fit in your ear and play sounds or music. ? Acoustic neural stimulation. This involves using headphones to listen to music that contains an auditory signal. Over time, listening to this signal may change some pathways in your brain and make you less sensitive to tinnitus. This treatment is used for very severe cases when no other treatment is working.  Using hearing aids or cochlear implants if your tinnitus is related to hearing loss. Hearing aids are worn in the outer ear. Cochlear implants are surgically placed in the inner ear. Follow these instructions at home: Managing symptoms      When possible, avoid being in loud places and being exposed to loud sounds.  Wear hearing protection, such as earplugs, when you are exposed to loud noises.  Use a white noise machine, a humidifier, or other devices to mask the sound of tinnitus.    Practice techniques for reducing stress, such as meditation, yoga, or deep breathing. Work with your health care provider if you need help with managing stress.  Sleep with your head slightly raised. This may reduce the impact of tinnitus. General instructions  Do not use stimulants, such as nicotine, alcohol, or caffeine. Talk with your health  care provider about other stimulants to avoid. Stimulants are substances that can make you feel alert and attentive by increasing certain activities in the body (such as heart rate and blood pressure). These substances may make tinnitus worse.  Take over-the-counter and prescription medicines only as told by your health care provider.  Try to get plenty of sleep each night.  Keep all follow-up visits as told by your health care provider. This is important. Contact a health care provider if:  Your tinnitus continues for 3 weeks or longer without stopping.  You develop sudden hearing loss.  Your symptoms get worse or do not get better with home care.  You feel you are not able to manage the stress of living with tinnitus. Get help right away if:  You develop tinnitus after a head injury.  You have tinnitus along with any of the following: ? Dizziness. ? Loss of balance. ? Nausea and vomiting. ? Sudden, severe headache. These symptoms may represent a serious problem that is an emergency. Do not wait to see if the symptoms will go away. Get medical help right away. Call your local emergency services (911 in the [place] Do not drive yourself to the hospital. Summary  Tinnitus refers to hearing a sound when there is no actual source for that sound. This is often described as ringing in the ears.  Symptoms may affect only one ear (unilateral) or both ears (bilateral).  Use a white noise machine, a humidifier, or other devices to mask the sound of tinnitus.  Do not use stimulants, such as nicotine, alcohol, or caffeine. Talk with your health care provider about other stimulants to avoid. These substances may make tinnitus worse. This information is not intended to replace advice given to you by your health care provider. Make sure you discuss any questions you have with your health care provider. Document Revised: 09/20/2018 Document Reviewed: 12/16/2016 Elsevier Patient Education  2020  [employer]  Meniere Disease  Meniere disease is an inner ear disorder. It causes attacks of a spinning sensation (vertigo), dizziness, and ringing in the ear (tinnitus). It also causes hearing loss and a feeling of fullness or pressure in the ear. This is a lifelong condition, and it may get worse over time. You may have drop attacks or severe dizziness that makes you fall. A drop attack is when you suddenly fall without losing consciousness and you quickly recover after a few seconds or minutes. What are the causes? This condition is caused by having too much of the fluid that is in your inner ear (endolymph). When fluid builds up in your inner ear, it affects the nerves that control balance and hearing. The reason for the fluid buildup is not known. Possible causes include:  Allergies.  An abnormal reaction of the body's defense system (autoimmune disease).  Viral infection of the inner ear.  Head injury. What increases the risk? You are more likely to develop this condition if:  You are older than age 57.  You have a family history of Meniere disease.  You have a history of autoimmune disease.  You have a history of migraine headaches. What are the signs or  symptoms? Symptoms of this condition can come and go and may last for up to 4 hours at a time. Symptoms usually start in one ear. They may become more frequent and eventually involve both ears. Symptoms can include:  Fullness and pressure in your ear.  Roaring or ringing in your ear.  Vertigo and loss of balance.  Dizziness.  Decreased hearing.  Nausea and vomiting. How is this diagnosed? This condition is diagnosed based on:  A physical exam.  Tests , such as: ? A hearing test (audiogram). ? An electronystagmogram. This tests your balance nerve (vestibular nerve). ? Imaging studies of your inner ear, such as CT scan or MRI. ? Other balance tests, such as rotational or balance platform tests. How is this  treated? There is no cure for this condition, but treatment can help to manage your symptoms. Treatment may include:  A low-salt diet. Limiting salt may help to reduce fluid in the body and relieve symptoms.  Oral or injected medicines to reduce or control: ? Vertigo. ? Nausea. ? Fluid retention. ? Dizziness.  Use of an air pressure pulse generator. This is a machine that sends small pressure pulses into your ear canal.  Hearing aids.  Inner ear surgery. This is rare. When you have symptoms, it can be helpful to lie down on a flat surface and focus your eyes on one object that does not move. Try to stay in that position until your symptoms go away. Follow these instructions at home: Eating and drinking  Eat the same amount of food at the same time every day, including snacks.  Do not skip meals.  Avoid caffeine.  Drink enough fluids to keep your urine clear or pale yellow.  Limit alcoholic drinks to one drink a day for non-pregnant women and 2 drinks a day for men. One drink equals 12 oz of beer, 5 oz of wine, or 1 oz of hard liquor.  Limit the salt (sodium) in your diet as told by your health care provider. Check ingredients and nutrition facts on packaged foods and beverages.  Do not eat foods that contain monosodium glutamate (MSG). General instructions  Do not use any products that contain nicotine or tobacco, such as cigarettes and e-cigarettes. If you need help quitting, ask your health care provider.  Take over-the-counter and prescription medicines only as told by your health care provider.  Find ways to reduce or avoid stress. If you need help with this, ask your health care provider.  Do not drive if you have vertigo or dizziness. Contact a health care provider if:  You have symptoms that last longer than 4 hours.  You have new or worse symptoms. Get help right away if:  You have been vomiting for 24 hours.  You cannot keep fluids down.  You have chest  pain or trouble breathing. Summary  Meniere disease is an inner ear disorder. It causes attacks of a spinning sensation (vertigo), dizziness, and ringing in the ear (tinnitus). It also causes hearing loss and a feeling of fullness or pressure in the ear.  Symptoms of this condition can come and go and may last for up to 4 hours at a time.  When you have symptoms, it can be helpful to lie down on a flat surface and focus your eyes on one object that does not move. Try to stay in that position until your symptoms go away. This information is not intended to replace advice given to you by your health care  provider. Make sure you discuss any questions you have with your health care provider. Document Revised: 02/18/2017 Document Reviewed: 01/28/2016 Elsevier Patient Education  2020 [employer].

## 2019-08-28 NOTE — Progress Notes (Signed)
Date:  08/28/2019   Name:  Blake Phelps   DOB:  [birth year]   MRN:  [id number]   Chief Complaint: Tinnitus (got better, but then came back- listens to people through headphones at work)  Neurologic Problem The patient's pertinent negatives include no altered mental status, clumsiness, focal sensory loss, focal weakness, loss of balance, memory loss, near-syncope, slurred speech, syncope, visual change or weakness. Primary symptoms comment: for tinnitis. This is a chronic problem. The current episode started in the past 7 days. The neurological problem developed insidiously. The problem has been waxing and waning since onset. There was left-sided focality noted. Associated symptoms include diaphoresis, dizziness and nausea. Pertinent negatives include no abdominal pain, auditory change, aura, back pain, bladder incontinence, bowel incontinence, chest pain, confusion, fatigue, fever, headaches, light-headedness, neck pain, palpitations, shortness of breath, vertigo or vomiting. Past treatments include aspirin (flonase/zyrtec). The treatment provided mild relief. There is no history of a bleeding disorder, a clotting disorder, dementia, head trauma or mood changes.    Lab Results  Component Value Date   CREATININE 1.19 05/01/2019   BUN 13 05/01/2019   NA 138 05/01/2019   K 4.3 05/01/2019   CL 104 05/01/2019   CO2 25 05/01/2019   Lab Results  Component Value Date   CHOL 151 01/02/2019   HDL 39 (L) 01/02/2019   LDLCALC 92 01/02/2019   TRIG 109 01/02/2019   CHOLHDL 3.9 01/02/2019   No results found for: TSH No results found for: HGBA1C Lab Results  Component Value Date   WBC 10.8 (H) 05/01/2019   HGB 15.8 05/01/2019   HCT 47.4 05/01/2019   MCV 87.8 05/01/2019   PLT 232 05/01/2019   No results found for: ALT, AST, GGT, ALKPHOS, BILITOT   Review of Systems  Constitutional: Positive for diaphoresis. Negative for chills, fatigue and fever.  HENT: Negative for drooling, ear  discharge, ear pain and sore throat.   Respiratory: Negative for cough, shortness of breath and wheezing.   Cardiovascular: Negative for chest pain, palpitations, leg swelling and near-syncope.  Gastrointestinal: Positive for nausea. Negative for abdominal pain, blood in stool, bowel incontinence, constipation, diarrhea and vomiting.  Endocrine: Negative for polydipsia.  Genitourinary: Negative for bladder incontinence, dysuria, frequency, hematuria and urgency.  Musculoskeletal: Negative for back pain, myalgias and neck pain.  Skin: Negative for rash.  Allergic/Immunologic: Negative for environmental allergies.  Neurological: Positive for dizziness. Negative for vertigo, focal weakness, syncope, weakness, light-headedness, headaches and loss of balance.  Hematological: Does not bruise/bleed easily.  Psychiatric/Behavioral: Negative for confusion, memory loss and suicidal ideas. The patient is not nervous/anxious.     Patient Active Problem List   Diagnosis Date Noted  . Hydronephrosis with renal and ureteral calculus obstruction 07/18/2014    No Known Allergies  Past Surgical History:  Procedure Laterality Date  . CARPAL TUNNEL RELEASE  1992  . COLONOSCOPY  2010   cleared for 10 yrs- Dr Bluford Kaufmann  . COLONOSCOPY WITH PROPOFOL N/A 02/05/2019   Procedure: COLONOSCOPY WITH PROPOFOL;  Surgeon: Wyline Mood, MD;  Location: [facility] ENDOSCOPY;  Service: Gastroenterology;  Laterality: N/A;  . LITHOTRIPSY  07/2014    Social History   Tobacco Use  . Smoking status: Current Some Day Smoker    Types: Cigarettes  . Smokeless tobacco: Never Used  Substance Use Topics  . Alcohol use: No    Alcohol/week: 0.0 standard drinks  . Drug use: No     Medication list has been reviewed and updated.  Current  Meds  Medication Sig  . aspirin 81 MG EC tablet Take 81 mg by mouth daily.  . cetirizine (ZYRTEC) 5 MG tablet Take 5 mg by mouth daily.  . famotidine (PEPCID) 40 MG tablet Take 1 tablet (40 mg  total) by mouth daily.  . fluticasone (FLONASE) 50 MCG/ACT nasal spray Place 2 sprays into both nostrils daily.    PHQ 2/9 Scores 08/28/2019 12/01/2017 12/13/2016  PHQ - 2 Score 1 0 3  PHQ- 9 Score 1 - 9    BP Readings from Last 3 Encounters:  08/28/19 140/60  05/01/19 (!) 143/76  02/05/19 (!) 153/108    Physical Exam Vitals and nursing note reviewed.  HENT:     Head: Normocephalic.     Right Ear: Tympanic membrane and external ear normal.     Left Ear: Tympanic membrane and external ear normal.     Nose: Nose normal.  Eyes:     General: No scleral icterus.       Right eye: No discharge.        Left eye: No discharge.     Conjunctiva/sclera: Conjunctivae normal.     Pupils: Pupils are equal, round, and reactive to light.  Neck:     Thyroid: No thyromegaly.     Vascular: No JVD.     Trachea: No tracheal deviation.  Cardiovascular:     Rate and Rhythm: Normal rate and regular rhythm.     Heart sounds: Normal heart sounds. No murmur. No friction rub. No gallop.   Pulmonary:     Effort: No respiratory distress.     Breath sounds: Normal breath sounds. No wheezing or rales.  Abdominal:     General: Bowel sounds are normal.     Palpations: Abdomen is soft. There is no mass.     Tenderness: There is no abdominal tenderness. There is no guarding or rebound.  Musculoskeletal:        General: No tenderness. Normal range of motion.     Cervical back: Normal range of motion and neck supple.  Lymphadenopathy:     Cervical: No cervical adenopathy.  Skin:    General: Skin is warm.     Findings: No rash.  Neurological:     Mental Status: He is alert and oriented to person, place, and time.     Cranial Nerves: No cranial nerve deficit.     Deep Tendon Reflexes: Reflexes are normal and symmetric.     Wt Readings from Last 3 Encounters:  08/28/19 205 lb (93 kg)  05/01/19 198 lb (89.8 kg)  02/05/19 192 lb (87.1 kg)    BP 140/60   Pulse 80   Ht 5\' 7"  (1.702 m)   Wt 205 lb (93  kg)   BMI 32.11 kg/m   Assessment and Plan: 1. Tinnitus, unspecified laterality Chronic.  Recurrent.  Presently unstable.  Primarily involving the left ear.  Patient has had previous tinnitus which decreased her subsided and is now resumed.  There is decreased hearing suspecting that ear given the patient's current inability to hear well in conversational time and volume.  Will refer to ear nose and throat for evaluation of hearing and treatment of tinnitus. - Ambulatory referral to ENT

## 2019-09-04 ENCOUNTER — Other Ambulatory Visit: Payer: Self-pay | Admitting: Physician Assistant

## 2019-09-04 ENCOUNTER — Telehealth: Payer: Self-pay | Admitting: Family Medicine

## 2019-09-04 DIAGNOSIS — IMO0001 Reserved for inherently not codable concepts without codable children: Secondary | ICD-10-CM

## 2019-09-04 NOTE — Telephone Encounter (Signed)
Please call pt and tell him Dr Yetta Barre did not put him out of work, therefore we cannot give note to be out

## 2019-09-04 NOTE — Telephone Encounter (Signed)
LVM to patient letting him know we could not provide a work note.

## 2019-09-04 NOTE — Telephone Encounter (Signed)
Copied from CRM [id number]. Topic: General - Inquiry >> Sep 04, 2019 10:42 AM Leafy Ro wrote: Reason for CRM: pt saw dr Elenore Rota ent today for ringing in ears. Pt would like a work note from dr Yetta Barre from  June 8-11. Dr Genevive Bi wrote a work note for the patient to be out of work for 2 wks. Md unable to write work note for June 8-11 the reason he did not see pt until 09-04-2019. Please send work note/letter to pt my chart

## 2019-09-17 ENCOUNTER — Other Ambulatory Visit: Payer: Managed Care, Other (non HMO)

## 2019-11-15 ENCOUNTER — Encounter: Payer: Self-pay | Admitting: Family Medicine

## 2019-11-15 ENCOUNTER — Other Ambulatory Visit: Payer: Self-pay

## 2019-11-15 ENCOUNTER — Ambulatory Visit: Payer: Managed Care, Other (non HMO) | Admitting: Family Medicine

## 2019-11-15 VITALS — BP 160/92 | HR 64 | Ht 67.0 in | Wt 207.0 lb

## 2019-11-15 DIAGNOSIS — H6980 Other specified disorders of Eustachian tube, unspecified ear: Secondary | ICD-10-CM

## 2019-11-15 DIAGNOSIS — H9319 Tinnitus, unspecified ear: Secondary | ICD-10-CM

## 2019-11-15 DIAGNOSIS — H6982 Other specified disorders of Eustachian tube, left ear: Secondary | ICD-10-CM | POA: Diagnosis not present

## 2019-11-15 MED ORDER — PREDNISONE 10 MG PO TABS: 10.0000 mg | ORAL_TABLET | Freq: Every day | ORAL | 0 refills | Status: DC

## 2019-11-15 MED ORDER — FLUTICASONE PROPIONATE 50 MCG/ACT NA SUSP: 2.0000 | Freq: Every day | NASAL | 3 refills | Status: DC

## 2019-11-15 NOTE — Progress Notes (Signed)
Date:  11/15/2019   Name:  Blake Phelps   DOB:  1960-07-04   MRN:  [id number]   Chief Complaint: Ear Pain (pressure in L) ear)  Otalgia  There is pain in the left ear. This is a new problem. The current episode started in the past 7 days. The problem occurs constantly. The problem has been waxing and waning. There has been no fever. The pain is mild. Pertinent negatives include no abdominal pain, coughing, diarrhea, ear discharge, headaches, hearing loss, neck pain, rash, rhinorrhea, sore throat or vomiting. The treatment provided moderate relief.    Lab Results  Component Value Date   CREATININE 1.19 05/01/2019   BUN 13 05/01/2019   NA 138 05/01/2019   K 4.3 05/01/2019   CL 104 05/01/2019   CO2 25 05/01/2019   Lab Results  Component Value Date   CHOL 151 01/02/2019   HDL 39 (L) 01/02/2019   LDLCALC 92 01/02/2019   TRIG 109 01/02/2019   CHOLHDL 3.9 01/02/2019   No results found for: TSH No results found for: HGBA1C Lab Results  Component Value Date   WBC 10.8 (H) 05/01/2019   HGB 15.8 05/01/2019   HCT 47.4 05/01/2019   MCV 87.8 05/01/2019   PLT 232 05/01/2019   No results found for: ALT, AST, GGT, ALKPHOS, BILITOT   Review of Systems  Constitutional: Negative for chills and fever.  HENT: Positive for ear pain. Negative for drooling, ear discharge, hearing loss, rhinorrhea and sore throat.   Respiratory: Negative for cough, shortness of breath and wheezing.   Cardiovascular: Negative for chest pain, palpitations and leg swelling.  Gastrointestinal: Negative for abdominal pain, blood in stool, constipation, diarrhea, nausea and vomiting.  Endocrine: Negative for polydipsia.  Genitourinary: Negative for dysuria, frequency, hematuria and urgency.  Musculoskeletal: Negative for back pain, myalgias and neck pain.  Skin: Negative for rash.  Allergic/Immunologic: Negative for environmental allergies.  Neurological: Negative for dizziness and headaches.    Hematological: Does not bruise/bleed easily.  Psychiatric/Behavioral: Negative for suicidal ideas. The patient is not nervous/anxious.     Patient Active Problem List   Diagnosis Date Noted  . Hydronephrosis with renal and ureteral calculus obstruction 07/18/2014    No Known Allergies  Past Surgical History:  Procedure Laterality Date  . CARPAL TUNNEL RELEASE  1992  . COLONOSCOPY  2010   cleared for 10 yrs- Dr Bluford Kaufmann  . COLONOSCOPY WITH PROPOFOL N/A 02/05/2019   Procedure: COLONOSCOPY WITH PROPOFOL;  Surgeon: Wyline Mood, MD;  Location: [facility] ENDOSCOPY;  Service: Gastroenterology;  Laterality: N/A;  . LITHOTRIPSY  07/2014    Social History   Tobacco Use  . Smoking status: Current Some Day Smoker    Types: Cigarettes  . Smokeless tobacco: Never Used  Substance Use Topics  . Alcohol use: No    Alcohol/week: 0.0 standard drinks  . Drug use: No     Medication list has been reviewed and updated.  Current Meds  Medication Sig  . aspirin 81 MG EC tablet Take 81 mg by mouth daily.  . cetirizine (ZYRTEC) 5 MG tablet Take 5 mg by mouth daily.  . famotidine (PEPCID) 40 MG tablet Take 1 tablet (40 mg total) by mouth daily.  . fluticasone (FLONASE) 50 MCG/ACT nasal spray Place 2 sprays into both nostrils daily.    PHQ 2/9 Scores 11/15/2019 08/28/2019 12/01/2017 12/13/2016  PHQ - 2 Score 0 1 0 3  PHQ- 9 Score 3 1 - 9    GAD  7 : Generalized Anxiety Score 11/15/2019 08/28/2019  Nervous, Anxious, on Edge 0 0  Control/stop worrying 0 0  Worry too much - different things 0 0  Trouble relaxing 0 0  Restless 0 0  Easily annoyed or irritable 0 0  Afraid - awful might happen 0 0  Total GAD 7 Score 0 0    BP Readings from Last 3 Encounters:  11/15/19 (!) 160/92  08/28/19 140/60  05/01/19 (!) 143/76    Physical Exam Vitals and nursing note reviewed.  HENT:     Head: Normocephalic.     Right Ear: Ear canal and external ear normal. Tympanic membrane is retracted.     Left Ear: Ear  canal and external ear normal. Tympanic membrane is retracted.     Nose: Nose normal. No congestion or rhinorrhea.     Mouth/Throat:     Mouth: Mucous membranes are moist.  Eyes:     General: No scleral icterus.       Right eye: No discharge.        Left eye: No discharge.     Conjunctiva/sclera: Conjunctivae normal.     Pupils: Pupils are equal, round, and reactive to light.  Neck:     Thyroid: No thyromegaly.     Vascular: No JVD.     Trachea: No tracheal deviation.  Cardiovascular:     Rate and Rhythm: Normal rate and regular rhythm.     Heart sounds: Normal heart sounds. No murmur heard.  No friction rub. No gallop.   Pulmonary:     Effort: Pulmonary effort is normal. No respiratory distress.     Breath sounds: Normal breath sounds. No stridor. No wheezing, rhonchi or rales.  Chest:     Chest wall: No tenderness.  Abdominal:     General: Abdomen is flat. Bowel sounds are normal.     Palpations: Abdomen is soft. There is no mass.     Tenderness: There is no abdominal tenderness. There is no guarding or rebound.  Musculoskeletal:        General: No tenderness. Normal range of motion.     Cervical back: Normal range of motion and neck supple.  Lymphadenopathy:     Cervical: No cervical adenopathy.  Skin:    General: Skin is warm.     Capillary Refill: Capillary refill takes 2 to 3 seconds.     Findings: No rash.  Neurological:     Mental Status: He is alert and oriented to person, place, and time.     Cranial Nerves: No cranial nerve deficit.     Deep Tendon Reflexes: Reflexes are normal and symmetric.     Wt Readings from Last 3 Encounters:  11/15/19 207 lb (93.9 kg)  08/28/19 205 lb (93 kg)  05/01/19 198 lb (89.8 kg)    BP (!) 160/92   Pulse 64   Ht 5\' 7"  (1.702 m)   Wt 207 lb (93.9 kg)   BMI 32.42 kg/m   Assessment and Plan:  1. Dysfunction of left eustachian tube Recurrent.  Episodic.  Currently empty medically stable but waxes and wanes.  Patient is  noted to have retracted tympanic membranes bilateral.  Patient will continue Flonase and Zyrtec.  I have suggested that patient also include a decongestant such as Sudafed 30 mg as well as Afrin for 3 to 4 days.  2. Tinnitus, unspecified laterality This is resolving due to some old lady telling him to put up a piece of garlic in his  ear and somehow it is better, I have a feeling it might have been the Flonase that did the job. - fluticasone (FLONASE) 50 MCG/ACT nasal spray; Place 2 sprays into both nostrils daily.  Dispense: 48 g; Refill: 3  3. Dysfunction of Eustachian tube, unspecified laterality As noted above. - fluticasone (FLONASE) 50 MCG/ACT nasal spray; Place 2 sprays into both nostrils daily.  Dispense: 48 g; Refill: 3

## 2019-11-15 NOTE — Patient Instructions (Signed)
Eustachian Tube Dysfunction ° °Eustachian tube dysfunction refers to a condition in which a blockage develops in the narrow passage that connects the middle ear to the back of the nose (eustachian tube). The eustachian tube regulates air pressure in the middle ear by letting air move between the ear and nose. It also helps to drain fluid from the middle ear space. °Eustachian tube dysfunction can affect one or both ears. When the eustachian tube does not function properly, air pressure, fluid, or both can build up in the middle ear. °What are the causes? °This condition occurs when the eustachian tube becomes blocked or cannot open normally. Common causes of this condition include: °· Ear infections. °· Colds and other infections that affect the nose, mouth, and throat (upper respiratory tract). °· Allergies. °· Irritation from cigarette smoke. °· Irritation from stomach acid coming up into the esophagus (gastroesophageal reflux). The esophagus is the tube that carries food from the mouth to the stomach. °· Sudden changes in air pressure, such as from descending in an airplane or scuba diving. °· Abnormal growths in the nose or throat, such as: °? Growths that line the nose (nasal polyps). °? Abnormal growth of cells (tumors). °? Enlarged tissue at the back of the throat (adenoids). °What increases the risk? °You are more likely to develop this condition if: °· You smoke. °· You are overweight. °· You are a child who has: °? Certain birth defects of the mouth, such as cleft palate. °? Large tonsils or adenoids. °What are the signs or symptoms? °Common symptoms of this condition include: °· A feeling of fullness in the ear. °· Ear pain. °· Clicking or popping noises in the ear. °· Ringing in the ear. °· Hearing loss. °· Loss of balance. °· Dizziness. °Symptoms may get worse when the air pressure around you changes, such as when you travel to an area of high elevation, fly on an airplane, or go scuba diving. °How is  this diagnosed? °This condition may be diagnosed based on: °· Your symptoms. °· A physical exam of your ears, nose, and throat. °· Tests, such as those that measure: °? The movement of your eardrum (tympanogram). °? Your hearing (audiometry). °How is this treated? °Treatment depends on the cause and severity of your condition. °· In mild cases, you may relieve your symptoms by moving air into your ears. This is called "popping the ears." °· In more severe cases, or if you have symptoms of fluid in your ears, treatment may include: °? Medicines to relieve congestion (decongestants). °? Medicines that treat allergies (antihistamines). °? Nasal sprays or ear drops that contain medicines that reduce swelling (steroids). °? A procedure to drain the fluid in your eardrum (myringotomy). In this procedure, a small tube is placed in the eardrum to: °§ Drain the fluid. °§ Restore the air in the middle ear space. °? A procedure to insert a balloon device through the nose to inflate the opening of the eustachian tube (balloon dilation). °Follow these instructions at home: °Lifestyle °· Do not do any of the following until your health care provider approves: °? Travel to high altitudes. °? Fly in airplanes. °? Work in a pressurized cabin or room. °? Scuba dive. °· Do not use any products that contain nicotine or tobacco, such as cigarettes and e-cigarettes. If you need help quitting, ask your health care provider. °· Keep your ears dry. Wear fitted earplugs during showering and bathing. Dry your ears completely after. °General instructions °· Take over-the-counter   and prescription medicines only as told by your health care provider. °· Use techniques to help pop your ears as recommended by your health care provider. These may include: °? Chewing gum. °? Yawning. °? Frequent, forceful swallowing. °? Closing your mouth, holding your nose closed, and gently blowing as if you are trying to blow air out of your nose. °· Keep all  follow-up visits as told by your health care provider. This is important. °Contact a health care provider if: °· Your symptoms do not go away after treatment. °· Your symptoms come back after treatment. °· You are unable to pop your ears. °· You have: °? A fever. °? Pain in your ear. °? Pain in your head or neck. °? Fluid draining from your ear. °· Your hearing suddenly changes. °· You become very dizzy. °· You lose your balance. °Summary °· Eustachian tube dysfunction refers to a condition in which a blockage develops in the eustachian tube. °· It can be caused by ear infections, allergies, inhaled irritants, or abnormal growths in the nose or throat. °· Symptoms include ear pain, hearing loss, or ringing in the ears. °· Mild cases are treated with maneuvers to unblock the ears, such as yawning or ear popping. °· Severe cases are treated with medicines. Surgery may also be done (rare). °This information is not intended to replace advice given to you by your health care provider. Make sure you discuss any questions you have with your health care provider. °Document Revised: 06/28/2017 Document Reviewed: 06/28/2017 °Elsevier Patient Education © 2020 [employer] ° °

## 2020-02-18 ENCOUNTER — Telehealth: Payer: Self-pay | Admitting: Family Medicine

## 2020-02-18 NOTE — Telephone Encounter (Signed)
Patient is calling to see if Dr. Judithann Graves for his diverticulitis - Patient is having pain in the left of his belly button pain. Patient was prescribed amoxicillin. Patient is afraid to eat. CB- [phone]

## 2020-03-20 ENCOUNTER — Telehealth: Payer: Self-pay

## 2020-03-20 NOTE — Telephone Encounter (Unsigned)
Copied from CRM [id number]. Topic: General - Other >> Mar 20, 2020 12:08 PM Jaquita Rector A wrote: Reason for CRM: Patient called in to say that he had a Lithotripsy last week and think he has developed a UTI from passing all the stones. Asking Dr Yetta Barre if there is anything that can be done for him. Please call patient at Ph# [phone]

## 2020-03-20 NOTE — Telephone Encounter (Signed)
Can see him today to check u/a- please call

## 2020-03-20 NOTE — Telephone Encounter (Signed)
Pt might be coming up with the flu? Advised UC

## 2020-03-27 ENCOUNTER — Ambulatory Visit: Payer: Self-pay | Admitting: *Deleted

## 2020-03-27 NOTE — Telephone Encounter (Signed)
Positive Covid on 03/22/20. Requesting a visit to be cleared before travelling to see elderly family member. Scheduled appointment greater than 14 days of diagnosis.  Reason for Disposition . Health Information question, no triage required and triager able to answer question  Answer Assessment - Initial Assessment Questions 1. REASON FOR CALL or QUESTION: "What is your reason for calling today?" or "How can I best help you?" or "What question do you have that I can help answer?"     Isolation period questions  Protocols used: INFORMATION ONLY CALL - NO TRIAGE-A-AH

## 2020-04-02 ENCOUNTER — Telehealth (INDEPENDENT_AMBULATORY_CARE_PROVIDER_SITE_OTHER): Payer: Managed Care, Other (non HMO) | Admitting: Family Medicine

## 2020-04-02 ENCOUNTER — Encounter: Payer: Self-pay | Admitting: Family Medicine

## 2020-04-02 ENCOUNTER — Ambulatory Visit
Admission: RE | Admit: 2020-04-02 | Discharge: 2020-04-02 | Disposition: A | Discharge status: Home / Self Care | Payer: Managed Care, Other (non HMO) | Source: Ambulatory Visit | Attending: Family Medicine | Admitting: Family Medicine

## 2020-04-02 ENCOUNTER — Ambulatory Visit
Admission: RE | Admit: 2020-04-02 | Discharge: 2020-04-02 | Disposition: A | Discharge status: Home / Self Care | Payer: Managed Care, Other (non HMO) | Attending: Family Medicine | Admitting: Family Medicine

## 2020-04-02 ENCOUNTER — Other Ambulatory Visit: Payer: Self-pay

## 2020-04-02 VITALS — BP 120/78 | HR 80 | Temp 98.2°F | Ht 67.0 in | Wt 199.0 lb

## 2020-04-02 DIAGNOSIS — R053 Chronic cough: Secondary | ICD-10-CM

## 2020-04-02 DIAGNOSIS — J219 Acute bronchiolitis, unspecified: Secondary | ICD-10-CM

## 2020-04-02 DIAGNOSIS — U099 Post covid-19 condition, unspecified: Secondary | ICD-10-CM

## 2020-04-02 MED ORDER — PREDNISONE 10 MG PO TABS: 10.0000 mg | ORAL_TABLET | Freq: Every day | ORAL | 0 refills | Status: DC

## 2020-04-02 MED ORDER — MONTELUKAST SODIUM 10 MG PO TABS: 10.0000 mg | ORAL_TABLET | Freq: Every day | ORAL | 3 refills | Status: DC

## 2020-04-02 MED ORDER — GUAIFENESIN-CODEINE 100-10 MG/5ML PO SYRP: 5.0000 mL | ORAL_SOLUTION | Freq: Four times a day (QID) | ORAL | 0 refills | Status: DC | PRN

## 2020-04-02 NOTE — Patient Instructions (Signed)

## 2020-04-02 NOTE — Progress Notes (Signed)
Date:  04/02/2020   Name:  Blake Phelps   DOB:  1961-01-31   MRN:  [id number]   Chief Complaint: Sinusitis (S/p detected COVID 03/20/20- fevers are gone, but still has cough and cong. No SOB.)  Sinusitis This is a new problem. The problem has been gradually improving since onset. There has been no fever. The pain is moderate. Pertinent negatives include no chills, congestion, coughing, diaphoresis, ear pain, headaches, hoarse voice, neck pain, shortness of breath, sinus pressure, sneezing, sore throat or swollen glands. The treatment provided moderate relief.    Lab Results  Component Value Date   CREATININE 1.19 05/01/2019   BUN 13 05/01/2019   NA 138 05/01/2019   K 4.3 05/01/2019   CL 104 05/01/2019   CO2 25 05/01/2019   Lab Results  Component Value Date   CHOL 151 01/02/2019   HDL 39 (L) 01/02/2019   LDLCALC 92 01/02/2019   TRIG 109 01/02/2019   CHOLHDL 3.9 01/02/2019   No results found for: TSH No results found for: HGBA1C Lab Results  Component Value Date   WBC 10.8 (H) 05/01/2019   HGB 15.8 05/01/2019   HCT 47.4 05/01/2019   MCV 87.8 05/01/2019   PLT 232 05/01/2019   No results found for: ALT, AST, GGT, ALKPHOS, BILITOT   Review of Systems  Constitutional: Negative for chills, diaphoresis and fever.  HENT: Negative for congestion, drooling, ear discharge, ear pain, hoarse voice, nosebleeds, postnasal drip, sinus pressure, sneezing and sore throat.   Respiratory: Negative for cough, shortness of breath and wheezing.   Cardiovascular: Negative for chest pain, palpitations and leg swelling.  Gastrointestinal: Negative for abdominal pain, blood in stool, constipation, diarrhea and nausea.  Endocrine: Negative for polydipsia.  Genitourinary: Negative for dysuria, frequency, hematuria and urgency.  Musculoskeletal: Negative for back pain, myalgias and neck pain.  Skin: Negative for rash.  Allergic/Immunologic: Negative for environmental allergies.   Neurological: Negative for dizziness and headaches.  Hematological: Does not bruise/bleed easily.  Psychiatric/Behavioral: Negative for suicidal ideas. The patient is not nervous/anxious.     Patient Active Problem List   Diagnosis Date Noted  . Hydronephrosis with renal and ureteral calculus obstruction 07/18/2014    No Known Allergies  Past Surgical History:  Procedure Laterality Date  . CARPAL TUNNEL RELEASE  1992  . COLONOSCOPY  2010   cleared for 10 yrs- Dr Bluford Kaufmann  . COLONOSCOPY WITH PROPOFOL N/A 02/05/2019   Procedure: COLONOSCOPY WITH PROPOFOL;  Surgeon: Wyline Mood, MD;  Location: [facility] ENDOSCOPY;  Service: Gastroenterology;  Laterality: N/A;  . LITHOTRIPSY  07/2014    Social History   Tobacco Use  . Smoking status: Current Some Day Smoker    Types: Cigarettes  . Smokeless tobacco: Never Used  Substance Use Topics  . Alcohol use: No    Alcohol/week: 0.0 standard drinks  . Drug use: No     Medication list has been reviewed and updated.  Current Meds  Medication Sig  . aspirin 81 MG EC tablet Take 81 mg by mouth daily.  . cetirizine (ZYRTEC) 5 MG tablet Take 5 mg by mouth daily.  . famotidine (PEPCID) 40 MG tablet Take 1 tablet (40 mg total) by mouth daily.    PHQ 2/9 Scores 04/02/2020 11/15/2019 08/28/2019 12/01/2017  PHQ - 2 Score 0 0 1 0  PHQ- 9 Score 0 3 1 -    GAD 7 : Generalized Anxiety Score 04/02/2020 11/15/2019 08/28/2019  Nervous, Anxious, on Edge 0 0 0  Control/stop worrying 0 0 0  Worry too much - different things 0 0 0  Trouble relaxing 0 0 0  Restless 0 0 0  Easily annoyed or irritable 0 0 0  Afraid - awful might happen 0 0 0  Total GAD 7 Score 0 0 0    BP Readings from Last 3 Encounters:  04/02/20 120/78  11/15/19 (!) 160/92  08/28/19 140/60    Physical Exam Vitals and nursing note reviewed.  HENT:     Head: Normocephalic.     Right Ear: Tympanic membrane, ear canal and external ear normal.     Left Ear: Tympanic membrane, ear canal and  external ear normal.     Nose: Nose normal.     Mouth/Throat:     Mouth: Oropharynx is clear and moist. Mucous membranes are moist.  Eyes:     General: No scleral icterus.       Right eye: No discharge.        Left eye: No discharge.     Extraocular Movements: EOM normal.     Conjunctiva/sclera: Conjunctivae normal.     Pupils: Pupils are equal, round, and reactive to light.  Neck:     Thyroid: No thyromegaly.     Vascular: No JVD.     Trachea: No tracheal deviation.  Cardiovascular:     Rate and Rhythm: Normal rate and regular rhythm.     Pulses: Intact distal pulses.     Heart sounds: Normal heart sounds. No murmur heard. No friction rub. No gallop.   Pulmonary:     Effort: No respiratory distress.     Breath sounds: Normal breath sounds. No wheezing, rhonchi or rales.  Abdominal:     General: Bowel sounds are normal.     Palpations: Abdomen is soft. There is no hepatosplenomegaly or mass.     Tenderness: There is no abdominal tenderness. There is no CVA tenderness, guarding or rebound.  Musculoskeletal:        General: No tenderness or edema. Normal range of motion.     Cervical back: Normal range of motion and neck supple.  Lymphadenopathy:     Cervical: No cervical adenopathy.  Skin:    General: Skin is warm.     Findings: No rash.  Neurological:     General: No focal deficit present.     Mental Status: He is alert and oriented to person, place, and time.     Cranial Nerves: No cranial nerve deficit.     Deep Tendon Reflexes: Strength normal and reflexes are normal and symmetric.     Wt Readings from Last 3 Encounters:  04/02/20 199 lb (90.3 kg)  11/15/19 207 lb (93.9 kg)  08/28/19 205 lb (93 kg)    BP 120/78   Pulse 80   Temp 98.2 F (36.8 C) (Oral)   Ht 5\' 7"  (1.702 m)   Wt 199 lb (90.3 kg)   SpO2 99%   BMI 31.17 kg/m   Assessment and Plan:  1. Bronchiolitis Acute.  Persistent.  Presently stable.  But still has a cough.  Pulse ox is 98% and  physical exam is normal without rales rhonchi wheezes or rub.  Given the persistent cough with history of smoking we will do a chest x-ray.  We will treat with Singulair for reactive airway disease 10 mg once at bedtime.  Patient has been given Robitussin-AC as needed cough.  Patient is also been given a refill on a prednisone 10 mg once  a day for reactive airway disease concerns.  Upon review of chest x-ray will determine whether or not we initiate a azithromycin. - DG Chest 2 View; Future - guaiFENesin-codeine (ROBITUSSIN AC) 100-10 MG/5ML syrup; Take 5 mLs by mouth 4 (four) times daily as needed for cough.  Dispense: 118 mL; Refill: 0 - montelukast (SINGULAIR) 10 MG tablet; Take 1 tablet (10 mg total) by mouth at bedtime.  Dispense: 30 tablet; Refill: 3 - predniSONE (DELTASONE) 10 MG tablet; Take 1 tablet (10 mg total) by mouth daily with breakfast.  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  Patient had this while he was in [place].  Although patient is some better he continues to have a cough.  Exam today is unremarkable for persistence of COVID but information has been given for future reference on isolation and care. - DG Chest 2 View; Future - montelukast (SINGULAIR) 10 MG tablet; Take 1 tablet (10 mg total) by mouth at bedtime.  Dispense: 30 tablet; Refill: 3

## 2020-04-09 ENCOUNTER — Other Ambulatory Visit: Payer: Self-pay

## 2020-04-09 ENCOUNTER — Ambulatory Visit: Payer: Managed Care, Other (non HMO) | Admitting: Family Medicine

## 2020-04-09 ENCOUNTER — Encounter: Payer: Self-pay | Admitting: Family Medicine

## 2020-04-09 VITALS — BP 130/80 | HR 88 | Temp 98.3°F | Ht 67.0 in | Wt 202.0 lb

## 2020-04-09 DIAGNOSIS — R059 Cough, unspecified: Secondary | ICD-10-CM

## 2020-04-09 DIAGNOSIS — J0100 Acute maxillary sinusitis, unspecified: Secondary | ICD-10-CM

## 2020-04-09 MED ORDER — AMOXICILLIN-POT CLAVULANATE 875-125 MG PO TABS: 1.0000 | ORAL_TABLET | Freq: Two times a day (BID) | ORAL | 0 refills | Status: DC

## 2020-04-09 MED ORDER — GUAIFENESIN-CODEINE 100-10 MG/5ML PO SYRP: 5.0000 mL | ORAL_SOLUTION | Freq: Four times a day (QID) | ORAL | 0 refills | Status: DC | PRN

## 2020-04-09 NOTE — Progress Notes (Addendum)
Date:  04/09/2020   Name:  Blake Phelps   DOB:  1960-11-07   MRN:  [id number]   Chief Complaint: Sinusitis (Negative COVID- green production and drainage)  Sinusitis This is a new problem. The current episode started in the past 7 days. The problem has been waxing and waning since onset. There has been no fever. The fever has been present for 3 to 4 days. The pain is mild. Pertinent negatives include no chills, congestion, coughing, diaphoresis, ear pain, headaches, hoarse voice, neck pain, shortness of breath, sinus pressure, sneezing, sore throat or swollen glands. The treatment provided mild relief.    Lab Results  Component Value Date   CREATININE 1.19 05/01/2019   BUN 13 05/01/2019   NA 138 05/01/2019   K 4.3 05/01/2019   CL 104 05/01/2019   CO2 25 05/01/2019   Lab Results  Component Value Date   CHOL 151 01/02/2019   HDL 39 (L) 01/02/2019   LDLCALC 92 01/02/2019   TRIG 109 01/02/2019   CHOLHDL 3.9 01/02/2019   No results found for: TSH No results found for: HGBA1C Lab Results  Component Value Date   WBC 10.8 (H) 05/01/2019   HGB 15.8 05/01/2019   HCT 47.4 05/01/2019   MCV 87.8 05/01/2019   PLT 232 05/01/2019   No results found for: ALT, AST, GGT, ALKPHOS, BILITOT   Review of Systems  Constitutional: Negative for chills, diaphoresis and fever.  HENT: Negative for congestion, drooling, ear discharge, ear pain, hoarse voice, sinus pressure, sneezing and sore throat.   Respiratory: Negative for cough, shortness of breath and wheezing.   Cardiovascular: Negative for chest pain, palpitations and leg swelling.  Gastrointestinal: Negative for abdominal pain, blood in stool, constipation, diarrhea and nausea.  Endocrine: Negative for polydipsia.  Genitourinary: Negative for dysuria, frequency, hematuria and urgency.  Musculoskeletal: Negative for back pain, myalgias and neck pain.  Skin: Negative for rash.  Allergic/Immunologic: Negative for environmental  allergies.  Neurological: Negative for dizziness and headaches.  Hematological: Does not bruise/bleed easily.  Psychiatric/Behavioral: Negative for suicidal ideas. The patient is not nervous/anxious.     Patient Active Problem List   Diagnosis Date Noted  . Hydronephrosis with renal and ureteral calculus obstruction 07/18/2014    No Known Allergies  Past Surgical History:  Procedure Laterality Date  . CARPAL TUNNEL RELEASE  1992  . COLONOSCOPY  2010   cleared for 10 yrs- Dr Bluford Kaufmann  . COLONOSCOPY WITH PROPOFOL N/A 02/05/2019   Procedure: COLONOSCOPY WITH PROPOFOL;  Surgeon: Wyline Mood, MD;  Location: [facility] ENDOSCOPY;  Service: Gastroenterology;  Laterality: N/A;  . LITHOTRIPSY  07/2014    Social History   Tobacco Use  . Smoking status: Current Some Day Smoker    Types: Cigarettes  . Smokeless tobacco: Never Used  Substance Use Topics  . Alcohol use: No    Alcohol/week: 0.0 standard drinks  . Drug use: No     Medication list has been reviewed and updated.  Current Meds  Medication Sig  . aspirin 81 MG EC tablet Take 81 mg by mouth daily.  . cetirizine (ZYRTEC) 5 MG tablet Take 5 mg by mouth daily.  . famotidine (PEPCID) 40 MG tablet Take 1 tablet (40 mg total) by mouth daily.  . fluticasone (FLONASE) 50 MCG/ACT nasal spray Place 2 sprays into both nostrils daily.  . montelukast (SINGULAIR) 10 MG tablet Take 1 tablet (10 mg total) by mouth at bedtime.  . predniSONE (DELTASONE) 10 MG tablet Take  1 tablet (10 mg total) by mouth daily with breakfast.    PHQ 2/9 Scores 04/02/2020 11/15/2019 08/28/2019 12/01/2017  PHQ - 2 Score 0 0 1 0  PHQ- 9 Score 0 3 1 -    GAD 7 : Generalized Anxiety Score 04/02/2020 11/15/2019 08/28/2019  Nervous, Anxious, on Edge 0 0 0  Control/stop worrying 0 0 0  Worry too much - different things 0 0 0  Trouble relaxing 0 0 0  Restless 0 0 0  Easily annoyed or irritable 0 0 0  Afraid - awful might happen 0 0 0  Total GAD 7 Score 0 0 0    BP Readings  from Last 3 Encounters:  04/09/20 130/80  04/02/20 120/78  11/15/19 (!) 160/92    Physical Exam Vitals and nursing note reviewed.  HENT:     Head: Normocephalic.     Right Ear: Tympanic membrane, ear canal and external ear normal. There is no impacted cerumen.     Left Ear: Tympanic membrane, ear canal and external ear normal. There is no impacted cerumen.     Nose: No congestion or rhinorrhea.     Right Turbinates: Swollen.     Left Turbinates: Swollen.     Right Sinus: Maxillary sinus tenderness present. No frontal sinus tenderness.     Left Sinus: Maxillary sinus tenderness present. No frontal sinus tenderness.     Mouth/Throat:     Mouth: Oropharynx is clear and moist. Mucous membranes are moist.     Pharynx: Oropharynx is clear.  Eyes:     General: No scleral icterus.       Right eye: No discharge.        Left eye: No discharge.     Extraocular Movements: EOM normal.     Conjunctiva/sclera: Conjunctivae normal.     Pupils: Pupils are equal, round, and reactive to light.  Neck:     Thyroid: No thyromegaly.     Vascular: No JVD.     Trachea: No tracheal deviation.  Cardiovascular:     Rate and Rhythm: Normal rate and regular rhythm.     Pulses: Intact distal pulses.     Heart sounds: Normal heart sounds. No murmur heard. No friction rub. No gallop.   Pulmonary:     Effort: No respiratory distress.     Breath sounds: Normal breath sounds. No wheezing, rhonchi or rales.  Abdominal:     General: Bowel sounds are normal.     Palpations: Abdomen is soft. There is no hepatosplenomegaly or mass.     Tenderness: There is no abdominal tenderness. There is no CVA tenderness, guarding or rebound.  Musculoskeletal:        General: No tenderness or edema. Normal range of motion.     Cervical back: Normal range of motion and neck supple.  Lymphadenopathy:     Cervical: No cervical adenopathy.  Skin:    General: Skin is warm.     Findings: No rash.  Neurological:     Mental  Status: He is alert and oriented to person, place, and time.     Cranial Nerves: No cranial nerve deficit.     Deep Tendon Reflexes: Strength normal and reflexes are normal and symmetric.     Wt Readings from Last 3 Encounters:  04/09/20 202 lb (91.6 kg)  04/02/20 199 lb (90.3 kg)  11/15/19 207 lb (93.9 kg)    BP 130/80   Pulse 88   Temp 98.3 F (36.8 C) (Oral)  Ht 5\' 7"  (1.702 m)   Wt 202 lb (91.6 kg)   SpO2 99%   BMI 31.64 kg/m   Assessment and Plan: 1. Acute non-recurrent maxillary sinusitis New onset.  Persistent.  Relatively stable.  Exam and history is consistent for acute maxillary sinusitis with tenderness over the maxillary sinuses as well as frontal.  We will initiate Augmentin 875 mg 1 twice a day for 10 days. - amoxicillin-clavulanate (AUGMENTIN) 875-125 MG tablet; Take 1 tablet by mouth 2 (two) times daily.  Dispense: 20 tablet; Refill: 0  2. Cough New onset.  Persistent.  Patient has productive cough which is particular bothersome at night.  Patient will take a half a teaspoon during the day and a whole teaspoon at night to counter current symptomatic cough. - guaiFENesin-codeine (ROBITUSSIN AC) 100-10 MG/5ML syrup; Take 5 mLs by mouth 4 (four) times daily as needed for cough.  Dispense: 118 mL; Refill: 0

## 2020-06-24 ENCOUNTER — Other Ambulatory Visit: Payer: Self-pay

## 2020-06-24 ENCOUNTER — Encounter: Payer: Self-pay | Admitting: Family Medicine

## 2020-06-24 ENCOUNTER — Ambulatory Visit (INDEPENDENT_AMBULATORY_CARE_PROVIDER_SITE_OTHER): Payer: Managed Care, Other (non HMO) | Admitting: Family Medicine

## 2020-06-24 VITALS — BP 118/82 | HR 60 | Ht 67.0 in | Wt 208.0 lb

## 2020-06-24 DIAGNOSIS — Z780 Asymptomatic menopausal state: Secondary | ICD-10-CM

## 2020-06-24 DIAGNOSIS — R351 Nocturia: Secondary | ICD-10-CM | POA: Diagnosis not present

## 2020-06-24 DIAGNOSIS — R5383 Other fatigue: Secondary | ICD-10-CM

## 2020-06-24 DIAGNOSIS — Z0000 Encounter for general adult medical examination without abnormal findings: Secondary | ICD-10-CM | POA: Diagnosis not present

## 2020-06-24 LAB — HEMOCCULT GUIAC POC 1CARD (OFFICE): Fecal Occult Blood, POC: NEGATIVE

## 2020-06-24 NOTE — Progress Notes (Signed)
Date:  06/24/2020   Name:  ARGENIS KUMARI   DOB:  06/27/1960   MRN:  [id number]   Chief Complaint: Annual Exam  Patient is a 60 year old male who presents for a comprehensive physical exam. The patient reports the following problems: none. Health maintenance has been reviewed up to date.   Lab Results  Component Value Date   CREATININE 1.19 05/01/2019   BUN 13 05/01/2019   NA 138 05/01/2019   K 4.3 05/01/2019   CL 104 05/01/2019   CO2 25 05/01/2019   Lab Results  Component Value Date   CHOL 151 01/02/2019   HDL 39 (L) 01/02/2019   LDLCALC 92 01/02/2019   TRIG 109 01/02/2019   CHOLHDL 3.9 01/02/2019   No results found for: TSH No results found for: HGBA1C Lab Results  Component Value Date   WBC 10.8 (H) 05/01/2019   HGB 15.8 05/01/2019   HCT 47.4 05/01/2019   MCV 87.8 05/01/2019   PLT 232 05/01/2019   No results found for: ALT, AST, GGT, ALKPHOS, BILITOT   Review of Systems  Constitutional: Negative for chills and fever.  HENT: Positive for tinnitus. Negative for drooling, ear discharge, ear pain and sore throat.   Respiratory: Negative for apnea, cough, chest tightness, shortness of breath and wheezing.   Cardiovascular: Negative for chest pain, palpitations and leg swelling.  Gastrointestinal: Negative for abdominal pain, blood in stool, constipation, diarrhea and nausea.  Endocrine: Negative for polydipsia.  Genitourinary: Negative for dysuria, frequency, hematuria and urgency.  Musculoskeletal: Negative for back pain, myalgias and neck pain.  Skin: Negative for color change, pallor, rash and wound.  Allergic/Immunologic: Negative for environmental allergies.  Neurological: Negative for dizziness, tremors, weakness and headaches.  Hematological: Does not bruise/bleed easily.  Psychiatric/Behavioral: Negative for suicidal ideas. The patient is not nervous/anxious.     Patient Active Problem List   Diagnosis Date Noted  . Hydronephrosis with renal and  ureteral calculus obstruction 07/18/2014    No Known Allergies  Past Surgical History:  Procedure Laterality Date  . CARPAL TUNNEL RELEASE  1992  . COLONOSCOPY  2010   cleared for 10 yrs- Dr Bluford Kaufmann  . COLONOSCOPY WITH PROPOFOL N/A 02/05/2019   Procedure: COLONOSCOPY WITH PROPOFOL;  Surgeon: Wyline Mood, MD;  Location: [facility] ENDOSCOPY;  Service: Gastroenterology;  Laterality: N/A;  . LITHOTRIPSY  07/2014    Social History   Tobacco Use  . Smoking status: Current Some Day Smoker    Types: Cigarettes  . Smokeless tobacco: Never Used  Substance Use Topics  . Alcohol use: No    Alcohol/week: 0.0 standard drinks  . Drug use: No     Medication list has been reviewed and updated.  Current Meds  Medication Sig  . aspirin 81 MG EC tablet Take 81 mg by mouth daily.  . cetirizine (ZYRTEC) 5 MG tablet Take 5 mg by mouth daily.  . famotidine (PEPCID) 40 MG tablet Take 1 tablet (40 mg total) by mouth daily.  . fluticasone (FLONASE) 50 MCG/ACT nasal spray Place 2 sprays into both nostrils daily.  . [DISCONTINUED] montelukast (SINGULAIR) 10 MG tablet Take 1 tablet (10 mg total) by mouth at bedtime.    PHQ 2/9 Scores 06/24/2020 04/02/2020 11/15/2019 08/28/2019  PHQ - 2 Score 0 0 0 1  PHQ- 9 Score 0 0 3 1    GAD 7 : Generalized Anxiety Score 06/24/2020 04/02/2020 11/15/2019 08/28/2019  Nervous, Anxious, on Edge 0 0 0 0  Control/stop worrying  0 0 0 0  Worry too much - different things 0 0 0 0  Trouble relaxing 0 0 0 0  Restless 0 0 0 0  Easily annoyed or irritable 0 0 0 0  Afraid - awful might happen 0 0 0 0  Total GAD 7 Score 0 0 0 0    BP Readings from Last 3 Encounters:  06/24/20 118/82  04/09/20 130/80  04/02/20 120/78    Physical Exam Vitals and nursing note reviewed.  Constitutional:      Appearance: He is well-groomed and overweight.  HENT:     Head: Normocephalic.     Jaw: There is normal jaw occlusion.     Right Ear: Hearing, tympanic membrane, ear canal and external ear  normal. There is no impacted cerumen.     Left Ear: Hearing, tympanic membrane, ear canal and external ear normal. There is no impacted cerumen.     Nose: Nose normal. No congestion or rhinorrhea.     Right Turbinates: Not swollen.     Left Turbinates: Not swollen.     Mouth/Throat:     Lips: Pink.     Mouth: Mucous membranes are moist.     Dentition: Normal dentition.     Tongue: No lesions.     Palate: No mass.     Pharynx: Oropharynx is clear. Uvula midline.  Eyes:     General: Lids are normal. Vision grossly intact. Gaze aligned appropriately. No scleral icterus.       Right eye: No discharge.        Left eye: No discharge.     Conjunctiva/sclera: Conjunctivae normal.     Pupils: Pupils are equal, round, and reactive to light.     Funduscopic exam:    Right eye: Red reflex present.        Left eye: Red reflex present. Neck:     Thyroid: No thyroid mass or thyromegaly.     Vascular: Normal carotid pulses. No carotid bruit, hepatojugular reflux or JVD.     Trachea: Trachea and phonation normal. No tracheal deviation.  Cardiovascular:     Rate and Rhythm: Normal rate and regular rhythm.     Chest Wall: PMI is not displaced.     Pulses: Normal pulses.          Carotid pulses are 2+ on the right side and 2+ on the left side.      Radial pulses are 2+ on the right side and 2+ on the left side.       Femoral pulses are 2+ on the right side and 2+ on the left side.      Popliteal pulses are 2+ on the right side and 2+ on the left side.       Dorsalis pedis pulses are 2+ on the right side and 2+ on the left side.       Posterior tibial pulses are 2+ on the right side and 2+ on the left side.     Heart sounds: Normal heart sounds, S1 normal and S2 normal. No murmur heard.  No systolic murmur is present.  No diastolic murmur is present. No friction rub. No gallop. No S3 or S4 sounds.   Pulmonary:     Effort: Pulmonary effort is normal. No respiratory distress.     Breath sounds:  Normal breath sounds. No decreased breath sounds, wheezing, rhonchi or rales.  Chest:     Chest wall: No tenderness.  Breasts: Breasts are symmetrical.  Right: Normal. No swelling, bleeding, inverted nipple, mass, nipple discharge, skin change, tenderness, axillary adenopathy or supraclavicular adenopathy.     Left: Normal. No swelling, bleeding, inverted nipple, mass, nipple discharge, skin change, tenderness, axillary adenopathy or supraclavicular adenopathy.    Abdominal:     General: Bowel sounds are normal.     Palpations: Abdomen is soft. There is no hepatomegaly, splenomegaly or mass.     Tenderness: There is no abdominal tenderness. There is no guarding or rebound.  Musculoskeletal:        General: No tenderness. Normal range of motion.     Cervical back: Full passive range of motion without pain, normal range of motion and neck supple.     Right lower leg: No edema.     Left lower leg: No edema.  Lymphadenopathy:     Head:     Right side of head: No submental or submandibular adenopathy.     Left side of head: No submental or submandibular adenopathy.     Cervical: No cervical adenopathy.     Right cervical: No superficial, deep or posterior cervical adenopathy.    Left cervical: No superficial, deep or posterior cervical adenopathy.     Upper Body:     Right upper body: No supraclavicular or axillary adenopathy.     Left upper body: No supraclavicular or axillary adenopathy.  Skin:    General: Skin is warm.     Capillary Refill: Capillary refill takes less than 2 seconds.     Findings: No bruising, erythema or rash.  Neurological:     Mental Status: He is alert and oriented to person, place, and time.     Cranial Nerves: Cranial nerves are intact. No cranial nerve deficit, dysarthria or facial asymmetry.     Sensory: No sensory deficit.     Motor: No weakness.     Deep Tendon Reflexes: Reflexes are normal and symmetric.     Reflex Scores:      Tricep reflexes are 2+  on the right side and 2+ on the left side.      Bicep reflexes are 2+ on the right side and 2+ on the left side.      Brachioradialis reflexes are 2+ on the right side and 2+ on the left side.      Patellar reflexes are 2+ on the right side and 2+ on the left side.      Achilles reflexes are 2+ on the right side and 2+ on the left side. Psychiatric:        Attention and Perception: Attention and perception normal.        Mood and Affect: Mood and affect normal.        Speech: Speech normal.        Behavior: Behavior normal. Behavior is cooperative.     Wt Readings from Last 3 Encounters:  06/24/20 208 lb (94.3 kg)  04/09/20 202 lb (91.6 kg)  04/02/20 199 lb (90.3 kg)    BP 118/82   Pulse 60   Ht 5\' 7"  (1.702 m)   Wt 208 lb (94.3 kg)   BMI 32.58 kg/m   Assessment and Plan: 1. Annual physical exam Patient's chart was reviewed for previous encounters most recent labs most recent imaging and care everywhere.  There was no subjective objective concerns noted during history and physical exam.Jensen A Erdmann is a 60 y.o. male who presents today for his Complete Annual Exam. He feels well. He reports exercising . He  reports he is sleeping well. Immunizations are reviewed and recommendations provided.   Age appropriate screening tests are discussed. Counseling given for risk factor reduction interventions.  We will obtain lipid panel renal function panel and CBC. - Lipid Panel With LDL/HDL Ratio - Renal Function Panel - CBC with Differential/Platelet  2. Nocturia Patient has episodes of nocturia I received an exam we will check a PSA. - PSA - POCT Occult Blood Stool  3. Menopause Patient had mild menopausal fatigue and we will check testosterone free and total of at this time. - CBC with Differential/Platelet - Vitamin D 1,25 dihydroxy - Testosterone,Free and Total  4. Fatigue, unspecified type Patient has fatigue and we will check thyroid panel with TSH as well as testosterone  free and total. - Testosterone,Free and Total - Thyroid Panel With TSH

## 2020-06-26 LAB — TESTOSTERONE,FREE AND TOTAL
Testosterone, Free: 7.5 pg/mL (ref 6.6–18.1)
Testosterone: 377 ng/dL (ref 264–916)

## 2020-06-26 LAB — THYROID PANEL WITH TSH
Free Thyroxine Index: 2.2 (ref 1.2–4.9)
T3 Uptake Ratio: 28 % (ref 24–39)
T4, Total: 7.7 ug/dL (ref 4.5–12.0)
TSH: 3.09 u[IU]/mL (ref 0.450–4.500)

## 2020-07-03 ENCOUNTER — Other Ambulatory Visit: Payer: Self-pay

## 2020-07-03 DIAGNOSIS — H9319 Tinnitus, unspecified ear: Secondary | ICD-10-CM

## 2020-07-03 DIAGNOSIS — H6980 Other specified disorders of Eustachian tube, unspecified ear: Secondary | ICD-10-CM

## 2020-07-03 LAB — RENAL FUNCTION PANEL
Albumin: 4.5 g/dL (ref 3.8–4.8)
BUN/Creatinine Ratio: 11 (ref 10–24)
BUN: 10 mg/dL (ref 8–27)
CO2: 21 mmol/L (ref 20–29)
Calcium: 9.2 mg/dL (ref 8.6–10.2)
Chloride: 103 mmol/L (ref 96–106)
Creatinine, Ser: 0.93 mg/dL (ref 0.76–1.27)
Glucose: 107 mg/dL — ABNORMAL HIGH (ref 65–99)
Phosphorus: 3.7 mg/dL (ref 2.8–4.1)
Potassium: 4.2 mmol/L (ref 3.5–5.2)
Sodium: 141 mmol/L (ref 134–144)
eGFR: 93 mL/min/{1.73_m2} (ref >59)

## 2020-07-03 LAB — PSA: Prostate Specific Ag, Serum: 1.9 ng/mL (ref 0.0–4.0)

## 2020-07-03 LAB — CBC WITH DIFFERENTIAL/PLATELET
Basophils Absolute: 0 10*3/uL (ref 0.0–0.2)
Basos: 0 %
EOS (ABSOLUTE): 0.3 10*3/uL (ref 0.0–0.4)
Eos: 3 %
Hematocrit: 45.3 % (ref 37.5–51.0)
Hemoglobin: 15.4 g/dL (ref 13.0–17.7)
Immature Grans (Abs): 0 10*3/uL (ref 0.0–0.1)
Immature Granulocytes: 0 %
Lymphocytes Absolute: 3.2 10*3/uL — ABNORMAL HIGH (ref 0.7–3.1)
Lymphs: 39 %
MCH: 29.5 pg (ref 26.6–33.0)
MCHC: 34 g/dL (ref 31.5–35.7)
MCV: 87 fL (ref 79–97)
Monocytes Absolute: 0.6 10*3/uL (ref 0.1–0.9)
Monocytes: 8 %
Neutrophils Absolute: 4 10*3/uL (ref 1.4–7.0)
Neutrophils: 50 %
Platelets: 218 10*3/uL (ref 150–450)
RBC: 5.22 x10E6/uL (ref 4.14–5.80)
RDW: 12.3 % (ref 11.6–15.4)
WBC: 8 10*3/uL (ref 3.4–10.8)

## 2020-07-03 LAB — VITAMIN D 1,25 DIHYDROXY
Vitamin D 1, 25 (OH)2 Total: 42 pg/mL
Vitamin D2 1, 25 (OH)2: <10 pg/mL
Vitamin D3 1, 25 (OH)2: 42 pg/mL

## 2020-07-03 LAB — LIPID PANEL WITH LDL/HDL RATIO
Cholesterol, Total: 202 mg/dL — ABNORMAL HIGH (ref 100–199)
HDL: 35 mg/dL — ABNORMAL LOW (ref >39)
LDL Chol Calc (NIH): 134 mg/dL — ABNORMAL HIGH (ref 0–99)
LDL/HDL Ratio: 3.8 ratio — ABNORMAL HIGH (ref 0.0–3.6)
Triglycerides: 183 mg/dL — ABNORMAL HIGH (ref 0–149)
VLDL Cholesterol Cal: 33 mg/dL (ref 5–40)

## 2020-07-03 MED ORDER — FLUTICASONE PROPIONATE 50 MCG/ACT NA SUSP: 2.0000 | Freq: Every day | NASAL | 1 refills | Status: DC

## 2020-07-07 ENCOUNTER — Telehealth: Payer: Self-pay

## 2020-07-07 ENCOUNTER — Other Ambulatory Visit: Payer: Self-pay

## 2020-07-07 DIAGNOSIS — E782 Mixed hyperlipidemia: Secondary | ICD-10-CM

## 2020-07-07 MED ORDER — ATORVASTATIN CALCIUM 10 MG PO TABS
10.0000 mg | ORAL_TABLET | Freq: Every day | ORAL | 1 refills | Status: DC
Start: 2020-07-07 — End: 2020-08-06

## 2020-07-07 NOTE — Telephone Encounter (Signed)
Sent in atorv. To Albertine Grates- recheck lipids and hepatic in 6 weeks

## 2020-07-07 NOTE — Telephone Encounter (Unsigned)
Copied from CRM [id number]. Topic: General - Other >> Jul 04, 2020  1:51 PM Randol Kern wrote: Reason for CRM: Pt returned call to office for Delice Bison, states he missed her call. Called office, Please advise Best contact: [phone]  Pt is aware of his results, VM available

## 2020-07-07 NOTE — Progress Notes (Unsigned)
Sent in atorv to [employer] [facility]

## 2020-07-07 NOTE — Telephone Encounter (Signed)
Relation to pt: self  Call back number:[phone]  Pharmacy: [id number] [name], [place] - [address] AT [facility] OF [address] [facility] Phone:  [phone]  Fax:  [phone]       Reason for call:  Patient wanted to inform Blake Phelps, he is okay with starting the atorvastatin and thank you for leaving a detail message. Patient declined to speak with nurse regarding the below

## 2020-08-06 ENCOUNTER — Other Ambulatory Visit: Payer: Self-pay

## 2020-08-06 ENCOUNTER — Telehealth: Payer: Self-pay | Admitting: Family Medicine

## 2020-08-06 DIAGNOSIS — E782 Mixed hyperlipidemia: Secondary | ICD-10-CM

## 2020-08-06 MED ORDER — EZETIMIBE 10 MG PO TABS: 10.0000 mg | ORAL_TABLET | Freq: Every day | ORAL | 0 refills | Status: DC

## 2020-08-06 NOTE — Telephone Encounter (Signed)
Sent In Zetia- d/c Atorv- recheck in 6-8 weeks

## 2020-08-06 NOTE — Progress Notes (Unsigned)
Sent in Zetia 

## 2020-08-06 NOTE — Telephone Encounter (Signed)
Pt states he is having joint pain, leg aches and believes it may be from the atorvastatin . Would like to know if Dr Yetta Barre wants to repeat his labs in order to know if he can come off the  atorvastatin .

## 2020-09-18 ENCOUNTER — Telehealth: Payer: Self-pay

## 2020-09-18 ENCOUNTER — Encounter: Payer: Self-pay | Admitting: Family Medicine

## 2020-09-18 ENCOUNTER — Other Ambulatory Visit: Payer: Self-pay

## 2020-09-18 ENCOUNTER — Ambulatory Visit: Payer: Managed Care, Other (non HMO) | Admitting: Family Medicine

## 2020-09-18 VITALS — BP 130/80 | HR 80 | Ht 67.0 in | Wt 208.0 lb

## 2020-09-18 DIAGNOSIS — R053 Chronic cough: Secondary | ICD-10-CM

## 2020-09-18 DIAGNOSIS — F17210 Nicotine dependence, cigarettes, uncomplicated: Secondary | ICD-10-CM | POA: Diagnosis not present

## 2020-09-18 MED ORDER — BENZONATATE 100 MG PO CAPS: 100.0000 mg | ORAL_CAPSULE | Freq: Two times a day (BID) | ORAL | 0 refills | Status: DC | PRN

## 2020-09-18 MED ORDER — PREDNISONE 10 MG PO TABS
10.0000 mg | ORAL_TABLET | Freq: Every day | ORAL | 0 refills | Status: AC
Start: 2020-09-18 — End: ?

## 2020-09-18 MED ORDER — ALBUTEROL SULFATE HFA 108 (90 BASE) MCG/ACT IN AERS: 2.0000 | INHALATION_SPRAY | Freq: Four times a day (QID) | RESPIRATORY_TRACT | 2 refills | Status: DC | PRN

## 2020-09-18 NOTE — Telephone Encounter (Signed)
Copied from CRM [id number]. Topic: General - Other >> Sep 17, 2020  4:03 PM Gaetana Michaelis A wrote: Reason for CRM: Patient has made contact requesting for orders to be submitted for them to have their cholesterol checked  Patient is interested to see how their medication has helped  Please contact to advise further when possible  Patient would like to be contacted by staff member "Delice Bison" to discuss further

## 2020-09-18 NOTE — Progress Notes (Signed)
Date:  09/18/2020   Name:  Blake Phelps   DOB:  1960/04/25   MRN:  [id number]   Chief Complaint: Cough (Tickle cough with occasional phlegm)  Cough This is a recurrent problem. The current episode started more than 1 year ago. The problem has been waxing and waning. The problem occurs constantly. The cough is Productive of purulent sputum. Associated symptoms include nasal congestion. Pertinent negatives include no chest pain, chills, ear congestion, ear pain, fever, headaches, heartburn, hemoptysis, myalgias, postnasal drip, rash, rhinorrhea, sore throat, shortness of breath, sweats, weight loss or wheezing. Exacerbated by: heat. The treatment provided mild relief. There is no history of emphysema or environmental allergies.   Lab Results  Component Value Date   CREATININE 0.93 06/24/2020   BUN 10 06/24/2020   NA 141 06/24/2020   K 4.2 06/24/2020   CL 103 06/24/2020   CO2 21 06/24/2020   Lab Results  Component Value Date   CHOL 202 (H) 06/24/2020   HDL 35 (L) 06/24/2020   LDLCALC 134 (H) 06/24/2020   TRIG 183 (H) 06/24/2020   CHOLHDL 3.9 01/02/2019   Lab Results  Component Value Date   TSH 3.090 06/24/2020   No results found for: HGBA1C Lab Results  Component Value Date   WBC 8.0 06/24/2020   HGB 15.4 06/24/2020   HCT 45.3 06/24/2020   MCV 87 06/24/2020   PLT 218 06/24/2020   No results found for: ALT, AST, GGT, ALKPHOS, BILITOT   Review of Systems  Constitutional:  Negative for chills, fever and weight loss.  HENT:  Negative for drooling, ear discharge, ear pain, postnasal drip, rhinorrhea and sore throat.   Respiratory:  Positive for cough. Negative for hemoptysis, shortness of breath and wheezing.   Cardiovascular:  Negative for chest pain, palpitations and leg swelling.  Gastrointestinal:  Negative for abdominal pain, blood in stool, constipation, diarrhea, heartburn and nausea.  Endocrine: Negative for polydipsia.  Genitourinary:  Negative for dysuria,  frequency, hematuria and urgency.  Musculoskeletal:  Negative for back pain, myalgias and neck pain.  Skin:  Negative for rash.  Allergic/Immunologic: Negative for environmental allergies.  Neurological:  Negative for dizziness and headaches.  Hematological:  Does not bruise/bleed easily.  Psychiatric/Behavioral:  Negative for suicidal ideas. The patient is not nervous/anxious.    Patient Active Problem List   Diagnosis Date Noted   Hydronephrosis with renal and ureteral calculus obstruction 07/18/2014    No Known Allergies  Past Surgical History:  Procedure Laterality Date   CARPAL TUNNEL RELEASE  1992   COLONOSCOPY  2010   cleared for 10 yrs- Dr Bluford Kaufmann   COLONOSCOPY WITH PROPOFOL N/A 02/05/2019   Procedure: COLONOSCOPY WITH PROPOFOL;  Surgeon: Wyline Mood, MD;  Location: [facility] ENDOSCOPY;  Service: Gastroenterology;  Laterality: N/A;   LITHOTRIPSY  07/2014    Social History   Tobacco Use   Smoking status: Some Days    Pack years: 0.00    Types: Cigarettes   Smokeless tobacco: Never  Substance Use Topics   Alcohol use: No    Alcohol/week: 0.0 standard drinks   Drug use: No     Medication list has been reviewed and updated.  Current Meds  Medication Sig   aspirin 81 MG EC tablet Take 81 mg by mouth daily.   cetirizine (ZYRTEC) 5 MG tablet Take 5 mg by mouth daily.   famotidine (PEPCID) 40 MG tablet Take 1 tablet (40 mg total) by mouth daily.   fluticasone (FLONASE) 50 MCG/ACT  nasal spray Place 2 sprays into both nostrils daily.    PHQ 2/9 Scores 06/24/2020 04/02/2020 11/15/2019 08/28/2019  PHQ - 2 Score 0 0 0 1  PHQ- 9 Score 0 0 3 1    GAD 7 : Generalized Anxiety Score 06/24/2020 04/02/2020 11/15/2019 08/28/2019  Nervous, Anxious, on Edge 0 0 0 0  Control/stop worrying 0 0 0 0  Worry too much - different things 0 0 0 0  Trouble relaxing 0 0 0 0  Restless 0 0 0 0  Easily annoyed or irritable 0 0 0 0  Afraid - awful might happen 0 0 0 0  Total GAD 7 Score 0 0 0 0    BP  Readings from Last 3 Encounters:  09/18/20 130/80  06/24/20 118/82  04/09/20 130/80    Physical Exam Vitals and nursing note reviewed.  HENT:     Head: Normocephalic.     Right Ear: Tympanic membrane and external ear normal. There is no impacted cerumen.     Left Ear: Tympanic membrane and external ear normal. There is no impacted cerumen.     Nose: Nose normal. No congestion or rhinorrhea.  Eyes:     General: No scleral icterus.       Right eye: No discharge.        Left eye: No discharge.     Conjunctiva/sclera: Conjunctivae normal.     Pupils: Pupils are equal, round, and reactive to light.  Neck:     Thyroid: No thyromegaly.     Vascular: No JVD.     Trachea: No tracheal deviation.  Cardiovascular:     Rate and Rhythm: Normal rate and regular rhythm.     Heart sounds: Normal heart sounds. No murmur heard.   No friction rub. No gallop.  Pulmonary:     Effort: No respiratory distress.     Breath sounds: Normal breath sounds. No wheezing, rhonchi or rales.  Abdominal:     General: Bowel sounds are normal.     Palpations: Abdomen is soft. There is no mass.     Tenderness: There is no abdominal tenderness. There is no guarding or rebound.  Musculoskeletal:        General: No tenderness. Normal range of motion.     Cervical back: Normal range of motion and neck supple.  Lymphadenopathy:     Cervical: No cervical adenopathy.  Skin:    General: Skin is warm.     Coloration: Skin is not pale.     Findings: No bruising, erythema or rash.  Neurological:     Mental Status: He is alert and oriented to person, place, and time.     Cranial Nerves: No cranial nerve deficit.     Deep Tendon Reflexes: Reflexes are normal and symmetric.    Wt Readings from Last 3 Encounters:  09/18/20 208 lb (94.3 kg)  06/24/20 208 lb (94.3 kg)  04/09/20 202 lb (91.6 kg)    BP 130/80   Pulse 80   Ht 5\' 7"  (1.702 m)   Wt 208 lb (94.3 kg)   BMI 32.58 kg/m   Assessment and Plan:  1.  Chronic cough Chronic.  Uncontrolled.  Patient again has had been cough which is episodic and unprovoked.  Patient has a long history of smoking and has not had formal evaluation for COPD.  I suspect there is some degree of dust and that patient has been in and out with this cough multiple occasions.  We will obtain a  pulmonary consult for formal evaluation and possible pulmonary function test in the meantime we will treat with Tessalon Perles 100 mg every 8 as needed cough, prednisone 10 mg once a day and albuterol inhaler 2 puffs every 6 hours and patient is also being given an Anoro to use once a day. - benzonatate (TESSALON) 100 MG capsule; Take 1 capsule (100 mg total) by mouth 2 (two) times daily as needed for cough.  Dispense: 20 capsule; Refill: 0 - predniSONE (DELTASONE) 10 MG tablet; Take 1 tablet (10 mg total) by mouth daily with breakfast.  Dispense: 30 tablet; Refill: 0 - albuterol (VENTOLIN HFA) 108 (90 Base) MCG/ACT inhaler; Inhale 2 puffs into the lungs every 6 (six) hours as needed for wheezing or shortness of breath. Include aerochamber  Dispense: 8 g; Refill: 2 - Ambulatory referral to Pulmonology  2. Cigarette nicotine dependence without complication Patient has been advised of the health risks of smoking and counseled concerning cessation of tobacco products. I spent over 3 minutes for discussion and to answer questions.

## 2020-09-18 NOTE — Patient Instructions (Signed)

## 2021-06-26 ENCOUNTER — Encounter: Payer: Managed Care, Other (non HMO) | Admitting: Family Medicine

## 2021-07-01 ENCOUNTER — Encounter: Payer: Managed Care, Other (non HMO) | Admitting: Family Medicine

## 2021-10-01 IMAGING — CR DG CHEST 2V
2 series · 2 of 2 positions shown · non-contrast
Comparison: 01/25/2005.

CLINICAL DATA: Status post COVID.  Cough.

EXAM:
CHEST - 2 VIEW

[chest pa]
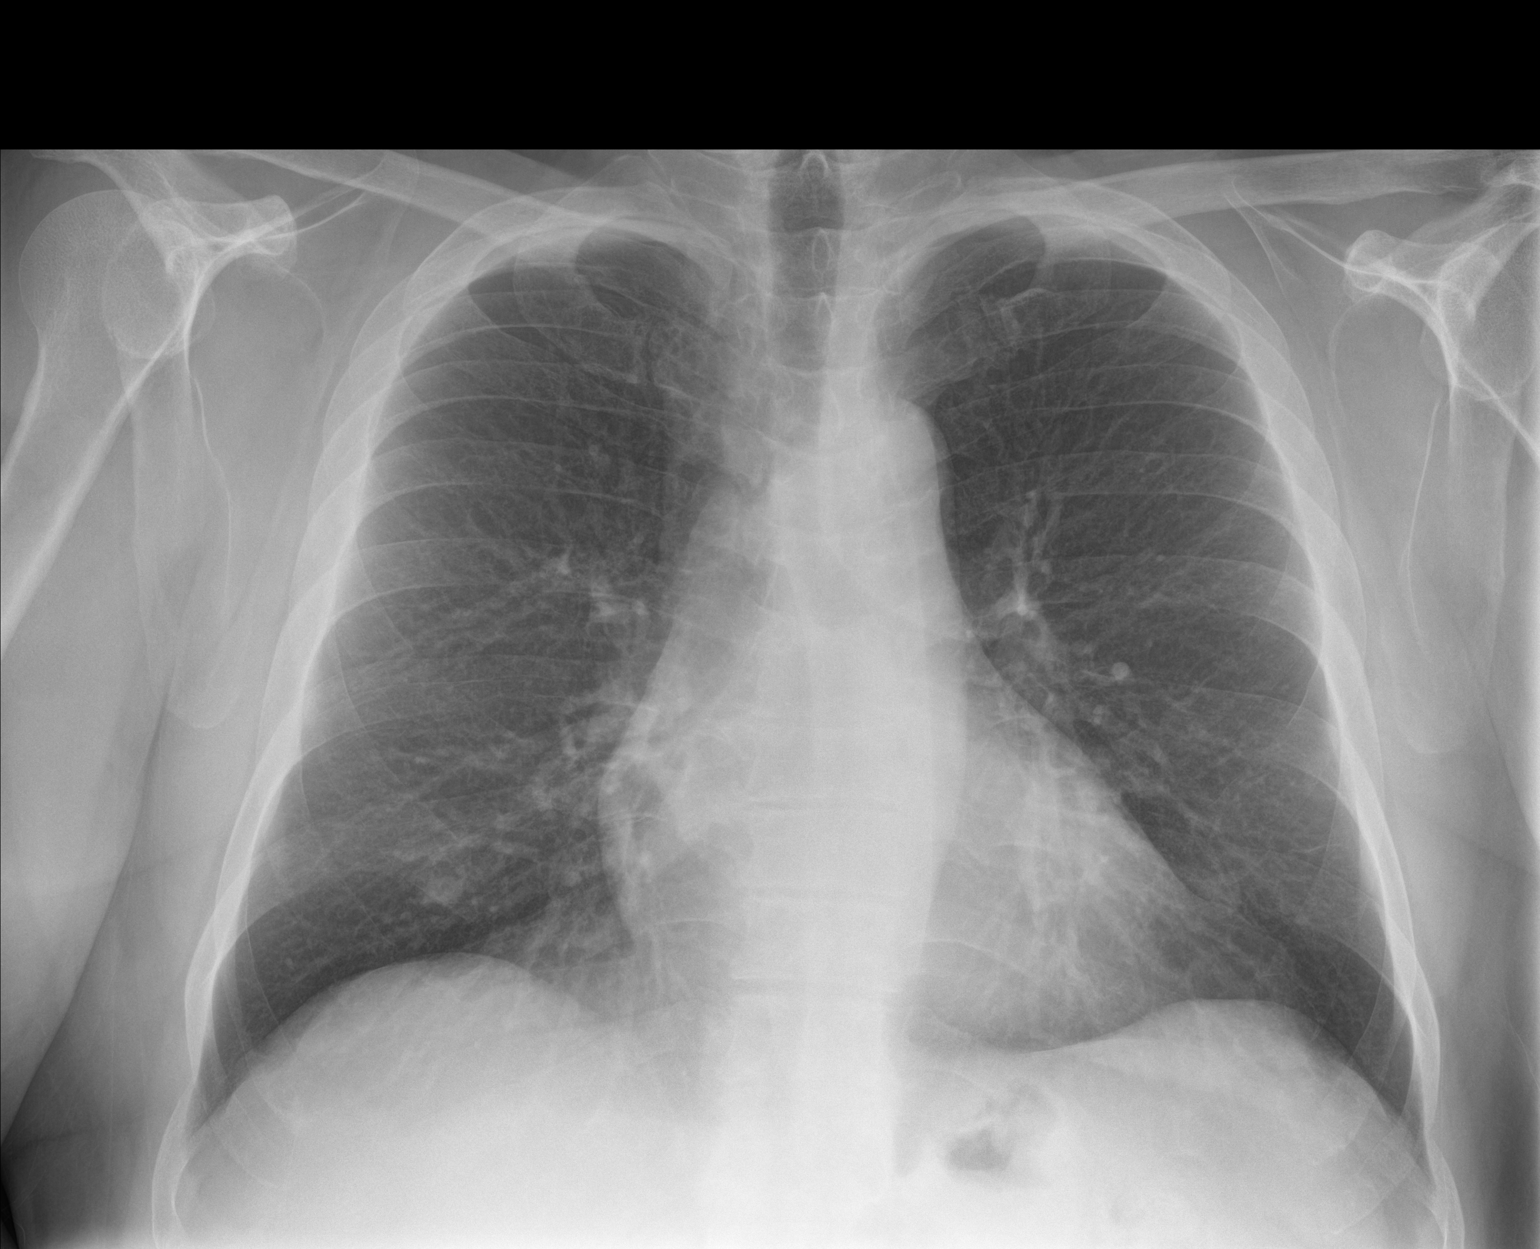

[chest lat]
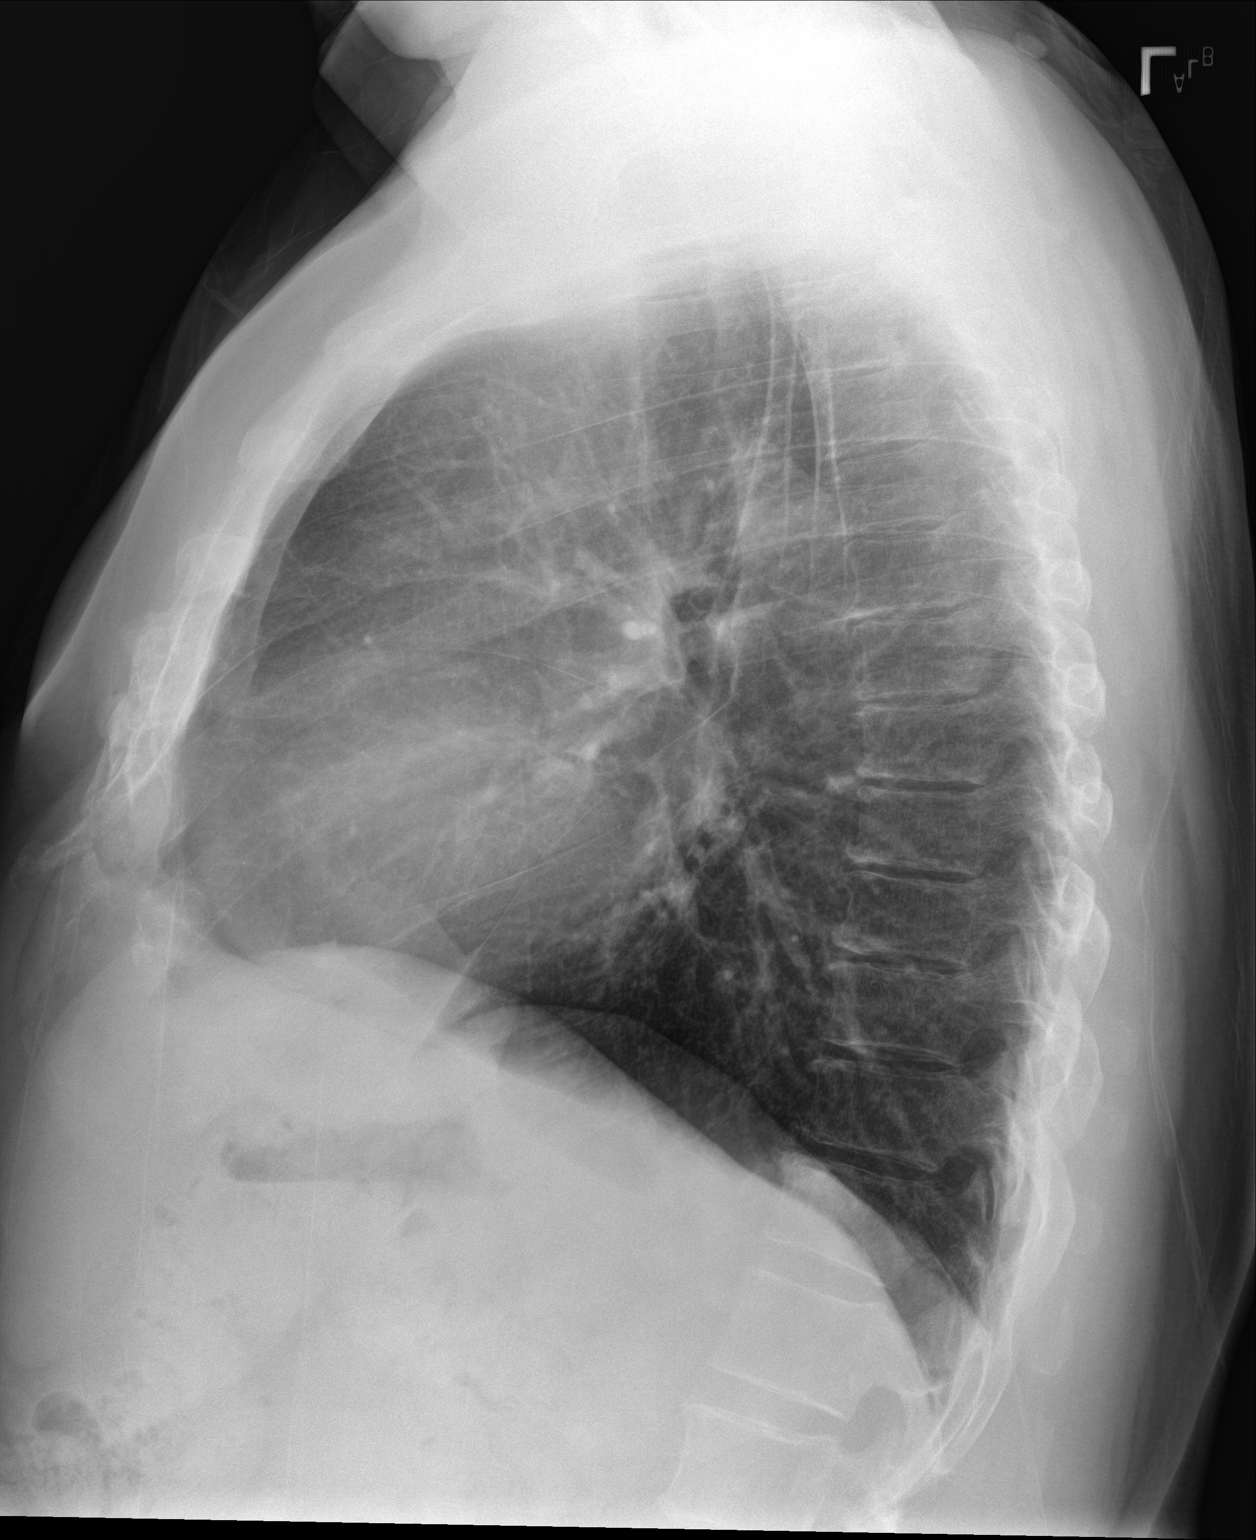

[2 of 2 positions shown; findings below may reference images not displayed]

FINDINGS: The heart size and mediastinal contours are within normal limits.
Both lungs are clear. The visualized skeletal structures are
unremarkable.
IMPRESSION: No active cardiopulmonary disease.

## 2022-01-18 ENCOUNTER — Encounter (INDEPENDENT_AMBULATORY_CARE_PROVIDER_SITE_OTHER): Payer: Self-pay

## 2022-04-23 DIAGNOSIS — R070 Pain in throat: Secondary | ICD-10-CM | POA: Diagnosis not present

## 2022-04-23 DIAGNOSIS — J069 Acute upper respiratory infection, unspecified: Secondary | ICD-10-CM | POA: Diagnosis not present

## 2022-11-10 DIAGNOSIS — H6692 Otitis media, unspecified, left ear: Secondary | ICD-10-CM | POA: Diagnosis not present

## 2022-11-10 DIAGNOSIS — H65192 Other acute nonsuppurative otitis media, left ear: Secondary | ICD-10-CM | POA: Diagnosis not present

## 2022-12-18 ENCOUNTER — Ambulatory Visit
Admission: EM | Admit: 2022-12-18 | Discharge: 2022-12-18 | Disposition: A | Discharge status: Home / Self Care | Payer: 59 | Attending: Emergency Medicine | Admitting: Emergency Medicine

## 2022-12-18 DIAGNOSIS — H6502 Acute serous otitis media, left ear: Secondary | ICD-10-CM

## 2022-12-18 MED ORDER — PREDNISONE 20 MG PO TABS: 40.0000 mg | ORAL_TABLET | Freq: Every day | ORAL | 0 refills | Status: DC

## 2022-12-18 MED ORDER — FLUTICASONE PROPIONATE 50 MCG/ACT NA SUSP
2.0000 | Freq: Every day | NASAL | 2 refills | Status: DC
Start: 2022-12-18 — End: 2022-12-19

## 2022-12-18 MED ORDER — GUAIFENESIN 100 MG/5ML PO LIQD
100.0000 mg | ORAL | 0 refills | Status: DC | PRN
Start: 2022-12-18 — End: 2022-12-19

## 2022-12-18 MED ORDER — CEFDINIR 300 MG PO CAPS: 300.0000 mg | ORAL_CAPSULE | Freq: Two times a day (BID) | ORAL | 0 refills | Status: DC

## 2022-12-18 NOTE — Discharge Instructions (Signed)
Today you are being treated for an infection of the  LEFT eardrum  Take cefdinir twice daily for 7 days, you should begin to see improvement after 48 hours of medication use and then it should progressively get better  Starting tomorrow take prednisone every morning with food to reduce inflammation within the sinuses in the ears, helps with pain  May use guaifenesin every 4 hours as needed to help minimize congestion and thin secretions allowing them to drain from the sinus cavity  May use fluticasone nasal spray every morning to further help clear out sinus  You may use Tylenol or ibuprofen for management of discomfort  May hold warm compresses to the ear for additional comfort  Please not attempted any ear cleaning or object or fluid placement into the ear canal to prevent further irritation

## 2022-12-18 NOTE — ED Triage Notes (Signed)
Patient presents to UC for left ear pain, cough x 3 days. States he has some difficulty taking a deep breathe in and reports some chest congestion.Has taking a decongestant with no relief.

## 2022-12-19 ENCOUNTER — Telehealth: Payer: Self-pay | Admitting: Emergency Medicine

## 2022-12-19 MED ORDER — CEFDINIR 300 MG PO CAPS: 300.0000 mg | ORAL_CAPSULE | Freq: Two times a day (BID) | ORAL | 0 refills | Status: AC

## 2022-12-19 MED ORDER — PREDNISONE 20 MG PO TABS: 40.0000 mg | ORAL_TABLET | Freq: Every day | ORAL | 0 refills | Status: DC

## 2022-12-19 MED ORDER — GUAIFENESIN 100 MG/5ML PO LIQD: 100.0000 mg | ORAL | 0 refills | Status: DC | PRN

## 2022-12-19 MED ORDER — FLUTICASONE PROPIONATE 50 MCG/ACT NA SUSP: 2.0000 | Freq: Every day | NASAL | 2 refills | Status: DC

## 2022-12-19 NOTE — Telephone Encounter (Signed)
Change in pharmacy

## 2022-12-19 NOTE — ED Provider Notes (Signed)
Renaldo Fiddler    CSN: [id number] Arrival date & time: 12/18/22  1527      History   Chief Complaint Chief Complaint  Patient presents with   Otalgia   Cough    HPI Blake Phelps is a 62 y.o. male.   Patient presents for evaluation of nasal congestion, rhinorrhea, postnasal drip, left-sided ear pain, bilateral ear fullness and a productive cough for 3 days.  Endorses increased effort to take deep breath but denies shortness of breath with exertion or wheezing.  Has been taking over-the-counter decongestants and mucolytic's without relief.  History of seasonal allergies.  No known sick contacts.  Denies presence of fever.  Tolerating food and liquids.  Tobacco use.  Past Medical History:  Diagnosis Date   Allergy    GERD (gastroesophageal reflux disease)     Patient Active Problem List   Diagnosis Date Noted   Hydronephrosis with renal and ureteral calculus obstruction 07/18/2014    Past Surgical History:  Procedure Laterality Date   CARPAL TUNNEL RELEASE  1992   COLONOSCOPY  2010   cleared for 10 yrs- Dr Bluford Kaufmann   COLONOSCOPY WITH PROPOFOL N/A 02/05/2019   Procedure: COLONOSCOPY WITH PROPOFOL;  Surgeon: Wyline Mood, MD;  Location: [facility] ENDOSCOPY;  Service: Gastroenterology;  Laterality: N/A;   LITHOTRIPSY  07/2014       Home Medications    Prior to Admission medications   Medication Sig Start Date End Date Taking? Authorizing Provider  cefdinir (OMNICEF) 300 MG capsule Take 1 capsule (300 mg total) by mouth 2 (two) times daily for 7 days. 12/18/22 12/25/22 Yes Shermaine Rivet R, NP  fluticasone (FLONASE) 50 MCG/ACT nasal spray Place 2 sprays into both nostrils daily. 12/18/22  Yes Lennyx Verdell R, NP  guaiFENesin (ROBITUSSIN) 100 MG/5ML liquid Take 5-10 mLs (100-200 mg total) by mouth every 4 (four) hours as needed for cough or to loosen phlegm. 12/18/22  Yes Yannet Rincon R, NP  predniSONE (DELTASONE) 20 MG tablet Take 2 tablets (40 mg total) by mouth daily.  12/18/22  Yes Roneshia Drew R, NP  albuterol (VENTOLIN HFA) 108 (90 Base) MCG/ACT inhaler Inhale 2 puffs into the lungs every 6 (six) hours as needed for wheezing or shortness of breath. Include aerochamber 09/18/20   Duanne Limerick, MD  aspirin 81 MG EC tablet Take 81 mg by mouth daily.    [provider]  benzonatate (TESSALON) 100 MG capsule Take 1 capsule (100 mg total) by mouth 2 (two) times daily as needed for cough. 09/18/20   Duanne Limerick, MD  cetirizine (ZYRTEC) 5 MG tablet Take 5 mg by mouth daily.    [provider]  famotidine (PEPCID) 40 MG tablet Take 1 tablet (40 mg total) by mouth daily. 02/07/19   Duanne Limerick, MD    Family History Family History  Problem Relation Age of Onset   Cancer Mother    Heart disease Mother    Cancer Father     Social History Social History   Tobacco Use   Smoking status: Some Days    Types: Cigarettes   Smokeless tobacco: Never  Substance Use Topics   Alcohol use: No    Alcohol/week: 0.0 standard drinks of alcohol   Drug use: No     Allergies   Patient has no known allergies.   Review of Systems Review of Systems  HENT:  Positive for ear pain.   Respiratory:  Positive for cough.      Physical  Exam Triage Vital Signs ED Triage Vitals  Encounter Vitals Group     BP 12/18/22 1547 (!) 175/80     Systolic BP Percentile --      Diastolic BP Percentile --      Pulse --      Resp 12/18/22 1547 18     Temp 12/18/22 1547 (!) 97 F (36.1 C)     Temp Source 12/18/22 1547 Temporal     SpO2 12/18/22 1547 95 %     Weight --      Height --      Head Circumference --      Peak Flow --      Pain Score 12/18/22 1608 0     Pain Loc --      Pain Education --      Exclude from Growth Chart --    No data found.  Updated Vital Signs BP (!) 164/70 (BP Location: Left Arm)   Temp (!) 97 F (36.1 C) (Temporal)   Resp 18   SpO2 95%   Visual Acuity Right Eye Distance:   Left Eye Distance:   Bilateral  Distance:    Right Eye Near:   Left Eye Near:    Bilateral Near:     Physical Exam Constitutional:      Appearance: Normal appearance.  HENT:     Head: Normocephalic.     Right Ear: Hearing, tympanic membrane, ear canal and external ear normal.     Left Ear: Hearing, ear canal and external ear normal. Tympanic membrane is erythematous.     Nose: Congestion and rhinorrhea present.     Mouth/Throat:     Mouth: Mucous membranes are moist.     Pharynx: No posterior oropharyngeal erythema.  Eyes:     Extraocular Movements: Extraocular movements intact.  Cardiovascular:     Rate and Rhythm: Normal rate and regular rhythm.     Pulses: Normal pulses.     Heart sounds: Normal heart sounds.  Pulmonary:     Effort: Pulmonary effort is normal.     Breath sounds: Normal breath sounds.  Skin:    General: Skin is warm and dry.  Neurological:     Mental Status: He is alert and oriented to person, place, and time. Mental status is at baseline.      UC Treatments / Results  Labs (all labs ordered are listed, but only abnormal results are displayed) Labs Reviewed - No data to display  EKG   Radiology No results found.  Procedures Procedures (including critical care time)  Medications Ordered in UC Medications - No data to display  Initial Impression / Assessment and Plan / UC Course  I have reviewed the triage vital signs and the nursing notes.  Pertinent labs & imaging results that were available during my care of the patient were reviewed by me and considered in my medical decision making (see chart for details).  Nonrecurrent acute serous otitis media of left ear  Erythema noted to the left tympanic membrane without abnormality to the canal or external ear, nasal congestion noted to the nasal turbinates otherwise stable exam, discussed findings with patient, placed on cefdinir as well as prednisone, additionally prescribed guaifenesin and fluticasone as he has had success  with these medicines in the past, discussed additional supportive measures and advised against ear cleaning until symptoms have resolved, may follow-up with this urgent care as needed if symptoms persist or worsen Final Clinical Impressions(s) / UC Diagnoses  Final diagnoses:  Non-recurrent acute serous otitis media of left ear     Discharge Instructions      Today you are being treated for an infection of the  LEFT eardrum  Take cefdinir twice daily for 7 days, you should begin to see improvement after 48 hours of medication use and then it should progressively get better  Starting tomorrow take prednisone every morning with food to reduce inflammation within the sinuses in the ears, helps with pain  May use guaifenesin every 4 hours as needed to help minimize congestion and thin secretions allowing them to drain from the sinus cavity  May use fluticasone nasal spray every morning to further help clear out sinus  You may use Tylenol or ibuprofen for management of discomfort  May hold warm compresses to the ear for additional comfort  Please not attempted any ear cleaning or object or fluid placement into the ear canal to prevent further irritation    ED Prescriptions     Medication Sig Dispense Auth. Provider   cefdinir (OMNICEF) 300 MG capsule Take 1 capsule (300 mg total) by mouth 2 (two) times daily for 7 days. 14 capsule Shantera Monts R, NP   guaiFENesin (ROBITUSSIN) 100 MG/5ML liquid Take 5-10 mLs (100-200 mg total) by mouth every 4 (four) hours as needed for cough or to loosen phlegm. 180 mL Gussie Towson R, NP   fluticasone (FLONASE) 50 MCG/ACT nasal spray Place 2 sprays into both nostrils daily. 11.1 mL Pardeep Pautz R, NP   predniSONE (DELTASONE) 20 MG tablet Take 2 tablets (40 mg total) by mouth daily. 10 tablet Valinda Hoar, NP      PDMP not reviewed this encounter.   Valinda Hoar, [place] 12/19/22 [phone]

## 2023-01-11 DIAGNOSIS — H6692 Otitis media, unspecified, left ear: Secondary | ICD-10-CM | POA: Diagnosis not present

## 2023-03-02 DIAGNOSIS — M9901 Segmental and somatic dysfunction of cervical region: Secondary | ICD-10-CM | POA: Diagnosis not present

## 2023-03-02 DIAGNOSIS — M9902 Segmental and somatic dysfunction of thoracic region: Secondary | ICD-10-CM | POA: Diagnosis not present

## 2023-03-02 DIAGNOSIS — M5414 Radiculopathy, thoracic region: Secondary | ICD-10-CM | POA: Diagnosis not present

## 2023-03-02 DIAGNOSIS — R519 Headache, unspecified: Secondary | ICD-10-CM | POA: Diagnosis not present

## 2023-03-08 ENCOUNTER — Ambulatory Visit (INDEPENDENT_AMBULATORY_CARE_PROVIDER_SITE_OTHER): Payer: 59 | Admitting: Family Medicine

## 2023-03-08 ENCOUNTER — Encounter: Payer: Self-pay | Admitting: Family Medicine

## 2023-03-08 VITALS — BP 134/80 | HR 63 | Temp 98.5°F | Ht 67.0 in | Wt 213.8 lb

## 2023-03-08 DIAGNOSIS — M5412 Radiculopathy, cervical region: Secondary | ICD-10-CM

## 2023-03-08 DIAGNOSIS — H8112 Benign paroxysmal vertigo, left ear: Secondary | ICD-10-CM

## 2023-03-08 MED ORDER — MELOXICAM 15 MG PO TABS: 15.0000 mg | ORAL_TABLET | Freq: Every day | ORAL | 1 refills | Status: DC

## 2023-03-08 NOTE — Progress Notes (Signed)
Date:  03/08/2023   Name:  Blake Phelps   DOB:  [birth year]   MRN:  [id number]   Chief Complaint: Neck Pain (Left sided. Radiates into shoulder. Intermittent. Patient seen UC 09/28 and was prescribed amoxicillin and the ear got better but the neck pain and shoulder pain is still present. Started months ago. Gets dizzy when turning head.)  d  Neck Pain  This is a new problem. The current episode started in the past 7 days. The problem occurs intermittently. The problem has been gradually improving. The pain is associated with nothing. The pain is present in the right side and left side. The pain is moderate. The symptoms are aggravated by twisting (to right). Pertinent negatives include no chest pain, fever, headaches, leg pain, numbness, pain with swallowing, paresis, photophobia, syncope, tingling, trouble swallowing, visual change, weakness or weight loss. He has tried NSAIDs for the symptoms. The treatment provided moderate relief.  Dizziness This is a recurrent problem. The current episode started more than 1 month ago. The problem occurs intermittently. The problem has been waxing and waning. Associated symptoms include neck pain and vertigo. Pertinent negatives include no abdominal pain, change in bowel habit, chest pain, fever, headaches, myalgias, numbness, visual change or weakness. Exacerbated by: turning head.    Lab Results  Component Value Date   NA 141 06/24/2020   K 4.2 06/24/2020   CO2 21 06/24/2020   GLUCOSE 107 (H) 06/24/2020   BUN 10 06/24/2020   CREATININE 0.93 06/24/2020   CALCIUM 9.2 06/24/2020   EGFR 93 06/24/2020   GFRNONAA >60 05/01/2019   Lab Results  Component Value Date   CHOL 202 (H) 06/24/2020   HDL 35 (L) 06/24/2020   LDLCALC 134 (H) 06/24/2020   TRIG 183 (H) 06/24/2020   CHOLHDL 3.9 01/02/2019   Lab Results  Component Value Date   TSH 3.090 06/24/2020   No results found for: "HGBA1C" Lab Results  Component Value Date   WBC 8.0 06/24/2020    HGB 15.4 06/24/2020   HCT 45.3 06/24/2020   MCV 87 06/24/2020   PLT 218 06/24/2020   No results found for: "ALT", "AST", "GGT", "ALKPHOS", "BILITOT" No results found for: "25OHVITD2", "25OHVITD3", "VD25OH"   Review of Systems  Constitutional:  Negative for fever and weight loss.  HENT:  Negative for trouble swallowing.   Eyes:  Negative for photophobia.  Respiratory:  Negative for apnea, shortness of breath, wheezing and stridor.   Cardiovascular:  Negative for chest pain, palpitations, leg swelling and syncope.  Gastrointestinal:  Negative for abdominal distention, abdominal pain and change in bowel habit.  Endocrine: Negative for polydipsia and polyuria.  Musculoskeletal:  Positive for neck pain. Negative for myalgias.  Neurological:  Positive for dizziness and vertigo. Negative for tingling, weakness, numbness and headaches.    Patient Active Problem List   Diagnosis Date Noted   Hydronephrosis with renal and ureteral calculus obstruction 07/18/2014    No Known Allergies  Past Surgical History:  Procedure Laterality Date   CARPAL TUNNEL RELEASE  1992   COLONOSCOPY  2010   cleared for 10 yrs- Dr Bluford Kaufmann   COLONOSCOPY WITH PROPOFOL N/A 02/05/2019   Procedure: COLONOSCOPY WITH PROPOFOL;  Surgeon: Wyline Mood, MD;  Location: [facility] ENDOSCOPY;  Service: Gastroenterology;  Laterality: N/A;   LITHOTRIPSY  07/2014    Social History   Tobacco Use   Smoking status: Some Days    Types: Cigarettes   Smokeless tobacco: Never  Substance Use Topics  Alcohol use: No    Alcohol/week: 0.0 standard drinks of alcohol   Drug use: No     Medication list has been reviewed and updated.  Current Meds  Medication Sig   aspirin 81 MG EC tablet Take 81 mg by mouth daily.   [DISCONTINUED] albuterol (VENTOLIN HFA) 108 (90 Base) MCG/ACT inhaler Inhale 2 puffs into the lungs every 6 (six) hours as needed for wheezing or shortness of breath. Include aerochamber       03/08/2023    2:31 PM  06/24/2020    8:43 AM 04/02/2020    9:42 AM 11/15/2019    1:46 PM  GAD 7 : Generalized Anxiety Score  Nervous, Anxious, on Edge 0 0 0 0  Control/stop worrying 0 0 0 0  Worry too much - different things 0 0 0 0  Trouble relaxing 0 0 0 0  Restless 0 0 0 0  Easily annoyed or irritable 0 0 0 0  Afraid - awful might happen 0 0 0 0  Total GAD 7 Score 0 0 0 0  Anxiety Difficulty Not difficult at all          03/08/2023    2:31 PM 06/24/2020    8:42 AM 04/02/2020    9:41 AM  Depression screen PHQ 2/9  Decreased Interest 0 0 0  Down, Depressed, Hopeless 0 0 0  PHQ - 2 Score 0 0 0  Altered sleeping 0 0 0  Tired, decreased energy 0 0 0  Change in appetite 0 0 0  Feeling bad or failure about yourself  0 0 0  Trouble concentrating 0 0 0  Moving slowly or fidgety/restless 0 0 0  Suicidal thoughts 0 0 0  PHQ-9 Score 0 0 0  Difficult doing work/chores Not difficult at all      BP Readings from Last 3 Encounters:  03/08/23 134/80  12/18/22 (!) 164/70  09/18/20 130/80    Physical Exam HENT:     Head: Normocephalic.     Right Ear: Tympanic membrane and external ear normal.     Left Ear: Tympanic membrane and external ear normal.     Nose: Nose normal.     Mouth/Throat:     Mouth: Mucous membranes are moist.  Eyes:     General: No scleral icterus.       Right eye: No discharge.        Left eye: No discharge.     Conjunctiva/sclera: Conjunctivae normal.     Pupils: Pupils are equal, round, and reactive to light.  Neck:     Thyroid: No thyromegaly.     Vascular: No JVD.     Trachea: No tracheal deviation.  Cardiovascular:     Rate and Rhythm: Normal rate and regular rhythm.     Heart sounds: Normal heart sounds. No murmur heard.    No friction rub. No gallop.  Pulmonary:     Effort: No respiratory distress.     Breath sounds: Normal breath sounds. No stridor. No wheezing, rhonchi or rales.  Chest:     Chest wall: No tenderness.  Abdominal:     General: Bowel sounds are  normal.     Palpations: Abdomen is soft. There is no mass.     Tenderness: There is no abdominal tenderness. There is no guarding or rebound.  Musculoskeletal:        General: No tenderness. Normal range of motion.     Cervical back: Normal range of motion and neck  supple.  Lymphadenopathy:     Cervical: No cervical adenopathy.  Skin:    General: Skin is warm.     Findings: No bruising or rash.  Neurological:     Mental Status: He is alert.     Deep Tendon Reflexes: Reflexes are normal and symmetric.     Wt Readings from Last 3 Encounters:  03/08/23 213 lb 12.8 oz (97 kg)  09/18/20 208 lb (94.3 kg)  06/24/20 208 lb (94.3 kg)    BP 134/80   Pulse 63   Temp 98.5 F (36.9 C) (Oral)   Ht 5\' 7"  (1.702 m)   Wt 213 lb 12.8 oz (97 kg)   SpO2 98%   BMI 33.49 kg/m   Assessment and Plan: 1. 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  Stable.  Patient's head some fullness in his left ear but there is been associated vertigo most likely of a benign positional type as that it depends on how he may turn his head that he gets dizzy.  Patient has been instructed to get some over-the-counter meclizine when this happens and we will continue to watch if it continues our next that would be referral to ear nose and throat.  2. Cervical radiculopathy Patient also has discomfort that extends from the trapezius down into the shoulder.  There is no weakness or numbness but there is some perhaps some paresthesias.  This is consistent with a stinger or radiculopathy.  Patient's been instructed to take meclizine with Tylenol as an additional to see if we can calm the inflammation and if continued and neck step is referral to orthopedics. - meloxicam (MOBIC) 15 MG tablet; Take 1 tablet (15 mg total) by mouth daily.  Dispense: 30 tablet; Refill: 1     Elizabeth Sauer, MD

## 2023-04-28 ENCOUNTER — Ambulatory Visit (INDEPENDENT_AMBULATORY_CARE_PROVIDER_SITE_OTHER): Payer: Medicare HMO | Admitting: Family Medicine

## 2023-04-28 ENCOUNTER — Encounter: Payer: Self-pay | Admitting: Family Medicine

## 2023-04-28 VITALS — BP 144/90 | HR 94 | Ht 67.0 in | Wt 212.0 lb

## 2023-04-28 DIAGNOSIS — M5412 Radiculopathy, cervical region: Secondary | ICD-10-CM

## 2023-04-28 DIAGNOSIS — M7542 Impingement syndrome of left shoulder: Secondary | ICD-10-CM | POA: Diagnosis not present

## 2023-04-28 MED ORDER — DICLOFENAC SODIUM 50 MG PO TBEC: 50.0000 mg | DELAYED_RELEASE_TABLET | Freq: Two times a day (BID) | ORAL | 0 refills | Status: AC | PRN

## 2023-04-28 MED ORDER — FLUTICASONE PROPIONATE 50 MCG/ACT NA SUSP: 2.0000 | Freq: Every day | NASAL | 0 refills | Status: AC

## 2023-04-28 MED ORDER — METHOCARBAMOL 500 MG PO TABS: 500.0000 mg | ORAL_TABLET | Freq: Three times a day (TID) | ORAL | 0 refills | Status: DC | PRN

## 2023-04-28 MED ORDER — GABAPENTIN 100 MG PO CAPS
100.0000 mg | ORAL_CAPSULE | Freq: Every day | ORAL | 0 refills | Status: DC
Start: 2023-04-28 — End: 2023-11-08

## 2023-04-28 NOTE — Assessment & Plan Note (Addendum)
 History of Present Illness Blake Phelps is a 63 year old male who presents with left shoulder and neck pain.   He has been experiencing left shoulder and neck pain for the past few months. The pain originates from the neck and radiates to the left shoulder, stopping at the elbow, accompanied by a tingling sensation that does not extend past the elbow. The pain is described as an 'electrical' feeling during certain movements. No weakness in the arms, and he is right-handed. He initially suspected an ear infection due to pain near the ear, but this was ruled out by his PCP.  He has tried various treatments including changing pillows, visiting a chiropractor, and receiving massage therapy, but these have not provided lasting relief. He was prescribed meloxicam  for 30 days, which effectively masked the pain during use, but symptoms returned shortly after discontinuation.  Physical Exam MUSCULOSKELETAL: Full range of motion in both shoulders with limited maximal extension and internal rotation on the left due to stiffness and pain. Pain localized to the left shoulder with isolated supraspinatus testing eliciting pain. Mild tenderness at the left levator scapulae. Non-tender paraspinal musculature. Negative Spurling's test bilaterally. Cervical torsion symmetric, both elicit pain, left more so than right, localizing to posterolateral neck. Neck flexion elicits posterior auricular pain on the left. 5/5 strength bilateral rotator cuff testing with isolated left supraspinatus testing eliciting pain. Negative Neer's test, equivocal Hawkins test. Positive Speed's test. Positive Yergason's test.  Assessment and Plan Cervical Radiculopathy Neuropathic pain radiating from the neck to the left elbow, exacerbated by certain movements. No weakness reported. Pain relief with Meloxicam , but symptoms returned after discontinuation. -Start Diclofenac  twice daily for a minimum of two weeks, then as needed. -Start  Gabapentin  at night for nerve pain. -Start Tizanidine as needed for muscle relaxation. -Engage in physical therapy focusing on neck and shoulder exercises. -Check cervical spine X-ray in six weeks if no improvement.

## 2023-04-28 NOTE — Progress Notes (Signed)
 Primary Care / Sports Medicine Office Visit  Patient Information:  Patient ID: Blake Phelps, male DOB: [birth year] Age: 63 y.o. MRN: [id number]   Blake Phelps is a pleasant 63 y.o. male presenting with the following:  Chief Complaint  Patient presents with   Shoulder Pain    Both, left aside worse, when turning head feels light headed, cloudy weather and rain makes it worst, meloxicam  did not help makes him drowsy     Vitals:   04/28/23 1139  BP: (!) 144/90  Pulse: 94  SpO2: 95%   Vitals:   04/28/23 1139  Weight: 212 lb (96.2 kg)  Height: 5' 7 (1.702 m)   Body mass index is 33.2 kg/m.  No results found.   Independent interpretation of notes and tests performed by another provider:   None  Procedures performed:   None  Pertinent History, Exam, Impression, and Recommendations:   Problem List Items Addressed This Visit     Cervical radiculopathy - Primary   History of Present Illness Blake Phelps is a 63 year old male who presents with left shoulder and neck pain.   He has been experiencing left shoulder and neck pain for the past few months. The pain originates from the neck and radiates to the left shoulder, stopping at the elbow, accompanied by a tingling sensation that does not extend past the elbow. The pain is described as an 'electrical' feeling during certain movements. No weakness in the arms, and he is right-handed. He initially suspected an ear infection due to pain near the ear, but this was ruled out by his PCP.  He has tried various treatments including changing pillows, visiting a chiropractor, and receiving massage therapy, but these have not provided lasting relief. He was prescribed meloxicam  for 30 days, which effectively masked the pain during use, but symptoms returned shortly after discontinuation.  Physical Exam MUSCULOSKELETAL: Full range of motion in both shoulders with limited maximal extension and internal rotation on the  left due to stiffness and pain. Pain localized to the left shoulder with isolated supraspinatus testing eliciting pain. Mild tenderness at the left levator scapulae. Non-tender paraspinal musculature. Negative Spurling's test bilaterally. Cervical torsion symmetric, both elicit pain, left more so than right, localizing to posterolateral neck. Neck flexion elicits posterior auricular pain on the left. 5/5 strength bilateral rotator cuff testing with isolated left supraspinatus testing eliciting pain. Negative Neer's test, equivocal Hawkins test. Positive Speed's test. Positive Yergason's test.  Assessment and Plan Cervical Radiculopathy Neuropathic pain radiating from the neck to the left elbow, exacerbated by certain movements. No weakness reported. Pain relief with Meloxicam , but symptoms returned after discontinuation. -Start Diclofenac  twice daily for a minimum of two weeks, then as needed. -Start Gabapentin  at night for nerve pain. -Start Tizanidine as needed for muscle relaxation. -Engage in physical therapy focusing on neck and shoulder exercises. -Check cervical spine X-ray in six weeks if no improvement.      Relevant Medications   fluticasone  (FLONASE ) 50 MCG/ACT nasal spray   diclofenac  (VOLTAREN ) 50 MG EC tablet   gabapentin  (NEURONTIN ) 100 MG capsule   methocarbamol  (ROBAXIN ) 500 MG tablet   Other Relevant Orders   DG Cervical Spine Complete   Impingement syndrome of left shoulder   See additional assessment(s) for further details.  Shoulder Impingement Syndrome Limited left shoulder extension and internal rotation with pain. Positive Speed's and Yergason's tests indicating biceps tendon and rotator cuff involvement. -Start home physical therapy focusing on  neck and shoulder exercises. -Use Tizanidine as needed for muscle relaxation.      Relevant Medications   diclofenac  (VOLTAREN ) 50 MG EC tablet   methocarbamol  (ROBAXIN ) 500 MG tablet     Orders &  Medications Medications:  Meds ordered this encounter  Medications   fluticasone  (FLONASE ) 50 MCG/ACT nasal spray    Sig: Place 2 sprays into both nostrils daily.    Dispense:  1 g    Refill:  0   diclofenac  (VOLTAREN ) 50 MG EC tablet    Sig: Take 1 tablet (50 mg total) by mouth 2 (two) times daily as needed.    Dispense:  60 tablet    Refill:  0   gabapentin  (NEURONTIN ) 100 MG capsule    Sig: Take 1 capsule (100 mg total) by mouth at bedtime.    Dispense:  45 capsule    Refill:  0   methocarbamol  (ROBAXIN ) 500 MG tablet    Sig: Take 1 tablet (500 mg total) by mouth every 8 (eight) hours as needed for muscle spasms.    Dispense:  60 tablet    Refill:  0   Orders Placed This Encounter  Procedures   DG Cervical Spine Complete     No follow-ups on file.     Blake JINNY Ku, MD, [facility]   Primary Care Sports Medicine Primary Care and Sports Medicine at [facility]

## 2023-04-28 NOTE — Assessment & Plan Note (Addendum)
 See additional assessment(s) for further details.  Shoulder Impingement Syndrome Limited left shoulder extension and internal rotation with pain. Positive Speed's and Yergason's tests indicating biceps tendon and rotator cuff involvement. -Start home physical therapy focusing on neck and shoulder exercises. -Use Tizanidine as needed for muscle relaxation.

## 2023-04-28 NOTE — Patient Instructions (Signed)
 Here is the management plan:  Neck Pain: 1. Start taking Diclofenac  twice daily for a minimum of two weeks, then continue as needed for inflammation. 2. Begin Gabapentin  at night to help manage nerve pain. 3. Use Tizanidine as needed for muscle relaxation. 4. Engage in physical therapy with a focus on neck and shoulder exercises. 5. If there is no improvement, plan for a cervical spine X-ray in six weeks.  Shoulder Impingement Syndrome: 1. Continue with physical therapy focusing on neck and shoulder exercises to improve range of motion and strength. 2. Use Tizanidine as needed to aid in muscle relaxation and alleviate discomfort.  Please feel free to contact us  if you have any questions or require further guidance on your treatment plan.

## 2023-05-04 DIAGNOSIS — H5203 Hypermetropia, bilateral: Secondary | ICD-10-CM | POA: Diagnosis not present

## 2023-05-04 DIAGNOSIS — H52223 Regular astigmatism, bilateral: Secondary | ICD-10-CM | POA: Diagnosis not present

## 2023-05-04 DIAGNOSIS — H524 Presbyopia: Secondary | ICD-10-CM | POA: Diagnosis not present

## 2023-05-04 DIAGNOSIS — H40001 Preglaucoma, unspecified, right eye: Secondary | ICD-10-CM | POA: Diagnosis not present

## 2023-05-14 DIAGNOSIS — J101 Influenza due to other identified influenza virus with other respiratory manifestations: Secondary | ICD-10-CM | POA: Diagnosis not present

## 2023-05-14 DIAGNOSIS — Z20822 Contact with and (suspected) exposure to covid-19: Secondary | ICD-10-CM | POA: Diagnosis not present

## 2023-05-14 DIAGNOSIS — M791 Myalgia, unspecified site: Secondary | ICD-10-CM | POA: Diagnosis not present

## 2023-06-09 ENCOUNTER — Ambulatory Visit: Payer: Medicare HMO | Admitting: Family Medicine

## 2023-07-15 ENCOUNTER — Ambulatory Visit (INDEPENDENT_AMBULATORY_CARE_PROVIDER_SITE_OTHER): Admitting: Family Medicine

## 2023-07-15 ENCOUNTER — Encounter: Payer: Self-pay | Admitting: Family Medicine

## 2023-07-15 VITALS — BP 154/98 | HR 64 | Ht 67.0 in | Wt 206.2 lb

## 2023-07-15 DIAGNOSIS — M2391 Unspecified internal derangement of right knee: Secondary | ICD-10-CM

## 2023-07-15 NOTE — Progress Notes (Signed)
 Primary Care / Sports Medicine Office Visit  Patient Information:  Patient ID: Blake Phelps, male DOB: 1960-08-15 Age: 63 y.o. MRN: [id number]   Blake Phelps is a pleasant 63 y.o. male presenting with the following:  Chief Complaint  Patient presents with   Knee Pain    Right knee pain, recently fell     Vitals:   07/15/23 1444 07/15/23 1456  BP: (!) 152/96 (!) 154/98  Pulse: 64   SpO2: 97%    Vitals:   07/15/23 1444  Weight: 206 lb 4 oz (93.6 kg)  Height: 5\' 7"  (1.702 m)   Body mass index is 32.3 kg/m.  No results found.   Independent interpretation of notes and tests performed by another provider:   None  Procedures performed:   None  Pertinent History, Exam, Impression, and Recommendations:   Problem List Items Addressed This Visit     Internal derangement of right knee - Primary   History of Present Illness Blake Phelps "Baker Bon" is a 63 year old male who presents with right knee pain following an injury.  He experienced right knee pain after an injury on Good Friday while maneuvering under a tree, resulting in a severe flexion of the knee. Despite the injury, he was able to walk with a limp and continued his activities the following day.  Recently, he experienced intense, throbbing pain in the middle of the night. There was no noticeable bruising or significant swelling immediately after the injury. He applied a makeshift knee strap for stabilization, which provided some relief. The pain is primarily felt when bending down or crouching, but not when ascending stairs. Despite the pain, he has continued with his usual activities, including trimming trees with a chainsaw.  He has not used any medications or treatments such as ice, heat, or topical creams. A friend suggested using castor oil, but he found it too sticky. He has a previous prescription for meloxicam  but has not taken it, as he previously found relief from shoulder and neck issues through  exercises.  No significant swelling or bruising of the knee. No locking, buckling, or near buckling of the knee.  Physical Exam INSPECTION: Trace swelling of the right knee, no ecchymosis or erythema. PALPATION: Non-tender lateral and medial patellar facets, quadriceps tendon, patellar tendon, lateral joint line, and distal hamstring tendons medially. Tenderness at the anterior aspect of medial joint line and equivocal to mild tenderness at the pes anserine bursa. RANGE OF MOTION: Range of motion 0-135 degrees with discomfort at maximum terminal flexion. STRENGTH: Resistive hip flexion is painless with 5/5 strength. SPECIAL TESTS: Negative anterior posterior drawer, negative valgus and varus stressing, McMurray test localizes medially.  Assessment and Plan Right knee pain with possible meniscal injury Acute right knee pain post-flexion injury with torsion element described. Medial joint line tenderness and positive McMurray test suggest possible meniscal injury. Conservative management discussed at this stage due to absence of severe symptoms. Discussed meloxicam  and brace usage. MRI considered if symptoms persist. - Provide hinged knee brace for daytime use for two weeks, remove for rest and non-strenuous activities. - Prescribe meloxicam  once daily for 14 days. Take with food, no other NSAIDs while on this medication. - Advise starting knee exercises after one week if symptoms improve, focusing on muscle strengthening around the knee. - Continue brace during strenuous activities for two more weeks if symptoms improve. - Order MRI if symptoms persist after two weeks. - Provide print out of exercises at  front desk for review and implementation after one week if symptoms improve.        Orders & Medications Medications: No orders of the defined types were placed in this encounter.  No orders of the defined types were placed in this encounter.    No follow-ups on file.     Ma Saupe, MD, [facility]   Primary Care Sports Medicine Primary Care and Sports Medicine at [facility]

## 2023-07-15 NOTE — Patient Instructions (Addendum)
 Patient Plan  Right Knee Pain: 1. Wear a hinged knee brace during the day for two weeks. Remove it during rest and non-strenuous activities. 2. Take meloxicam  once daily for 14 days with food. Do not use other NSAIDs during this time. 3. If symptoms improve, start knee exercises focusing on muscle strengthening around the knee after one week. 4. Continue using the brace during strenuous activities for an additional two weeks if symptoms improve. 5. If symptoms persist after two weeks, an MRI will be considered.  Red Flags: - Contact your provider if you experience worsening symptoms or any new severe symptoms.

## 2023-07-15 NOTE — Assessment & Plan Note (Signed)
 History of Present Illness Blake Phelps "Blake Phelps" is a 63 year old male who presents with right knee pain following an injury.  He experienced right knee pain after an injury on Good Friday while maneuvering under a tree, resulting in a severe flexion of the knee. Despite the injury, he was able to walk with a limp and continued his activities the following day.  Recently, he experienced intense, throbbing pain in the middle of the night. There was no noticeable bruising or significant swelling immediately after the injury. He applied a makeshift knee strap for stabilization, which provided some relief. The pain is primarily felt when bending down or crouching, but not when ascending stairs. Despite the pain, he has continued with his usual activities, including trimming trees with a chainsaw.  He has not used any medications or treatments such as ice, heat, or topical creams. A friend suggested using castor oil, but he found it too sticky. He has a previous prescription for meloxicam  but has not taken it, as he previously found relief from shoulder and neck issues through exercises.  No significant swelling or bruising of the knee. No locking, buckling, or near buckling of the knee.  Physical Exam INSPECTION: Trace swelling of the right knee, no ecchymosis or erythema. PALPATION: Non-tender lateral and medial patellar facets, quadriceps tendon, patellar tendon, lateral joint line, and distal hamstring tendons medially. Tenderness at the anterior aspect of medial joint line and equivocal to mild tenderness at the pes anserine bursa. RANGE OF MOTION: Range of motion 0-135 degrees with discomfort at maximum terminal flexion. STRENGTH: Resistive hip flexion is painless with 5/5 strength. SPECIAL TESTS: Negative anterior posterior drawer, negative valgus and varus stressing, McMurray test localizes medially.  Assessment and Plan Right knee pain with possible meniscal injury Acute right knee pain  post-flexion injury with torsion element described. Medial joint line tenderness and positive McMurray test suggest possible meniscal injury. Conservative management discussed at this stage due to absence of severe symptoms. Discussed meloxicam  and brace usage. MRI considered if symptoms persist. - Provide hinged knee brace for daytime use for two weeks, remove for rest and non-strenuous activities. - Prescribe meloxicam  once daily for 14 days. Take with food, no other NSAIDs while on this medication. - Advise starting knee exercises after one week if symptoms improve, focusing on muscle strengthening around the knee. - Continue brace during strenuous activities for two more weeks if symptoms improve. - Order MRI if symptoms persist after two weeks. - Provide print out of exercises at front desk for review and implementation after one week if symptoms improve.

## 2023-08-12 DIAGNOSIS — M2241 Chondromalacia patellae, right knee: Secondary | ICD-10-CM | POA: Diagnosis not present

## 2023-08-12 DIAGNOSIS — M25561 Pain in right knee: Secondary | ICD-10-CM | POA: Diagnosis not present

## 2023-08-12 DIAGNOSIS — M222X1 Patellofemoral disorders, right knee: Secondary | ICD-10-CM | POA: Diagnosis not present

## 2023-11-08 ENCOUNTER — Ambulatory Visit (INDEPENDENT_AMBULATORY_CARE_PROVIDER_SITE_OTHER): Admitting: Family Medicine

## 2023-11-08 ENCOUNTER — Encounter: Payer: Self-pay | Admitting: Family Medicine

## 2023-11-08 VITALS — BP 150/94 | HR 62 | Ht 67.0 in | Wt 206.0 lb

## 2023-11-08 DIAGNOSIS — Z79899 Other long term (current) drug therapy: Secondary | ICD-10-CM

## 2023-11-08 DIAGNOSIS — I10 Essential (primary) hypertension: Secondary | ICD-10-CM | POA: Insufficient documentation

## 2023-11-08 DIAGNOSIS — Z130 Encounter for screening for diseases of the blood and blood-forming organs and certain disorders involving the immune mechanism: Secondary | ICD-10-CM | POA: Diagnosis not present

## 2023-11-08 DIAGNOSIS — H6992 Unspecified Eustachian tube disorder, left ear: Secondary | ICD-10-CM | POA: Diagnosis not present

## 2023-11-08 DIAGNOSIS — Z13220 Encounter for screening for lipoid disorders: Secondary | ICD-10-CM

## 2023-11-08 DIAGNOSIS — Z136 Encounter for screening for cardiovascular disorders: Secondary | ICD-10-CM | POA: Diagnosis not present

## 2023-11-08 DIAGNOSIS — Z125 Encounter for screening for malignant neoplasm of prostate: Secondary | ICD-10-CM | POA: Diagnosis not present

## 2023-11-08 DIAGNOSIS — Z131 Encounter for screening for diabetes mellitus: Secondary | ICD-10-CM | POA: Diagnosis not present

## 2023-11-08 MED ORDER — AMLODIPINE-OLMESARTAN 5-20 MG PO TABS: 1.0000 | ORAL_TABLET | Freq: Every day | ORAL | 1 refills | Status: AC

## 2023-11-08 NOTE — Progress Notes (Signed)
 Established Patient Office Visit  Subjective   Patient ID: Blake Phelps, male    DOB: Aug 16, 1960  Age: 63 y.o. MRN: [id number]  Chief Complaint  Patient presents with   Transitions Of Care    Patient is here today to transition from Dr. Joshua care to new PCP     Assessment & Plan:   Problem List Items Addressed This Visit       Cardiovascular and Mediastinum   Primary hypertension - Primary   Relevant Medications   amLODipine -olmesartan  (AZOR ) 5-20 MG tablet   Other Visit Diagnoses       Eustachian tube dysfunction, left         Diabetes mellitus screening       Relevant Orders   Hemoglobin A1c     Screening for prostate cancer       Relevant Orders   PSA     Encounter for lipid screening for cardiovascular disease       Relevant Orders   Lipid panel     Screening for iron deficiency anemia       Relevant Orders   CBC with Differential     Encounter for long-term current use of medication       Relevant Orders   Comprehensive Metabolic Panel (CMET)     HTN : Uncontrolled, his BP goal 130 /80, Recommend starting AZOR  as prescribed, Dash diet and exercise. Left ear discomfort likely due to eustachian tube dysfunction, recommend Flonase  and anti histamine prn.  Routine blood work.   Return in about 4 weeks (around 12/06/2023) for HTN with PCP.   63 year old male with history of elevated blood pressure, HLD  presents to the clinic due to left ear discomfort.  X few months.  Reports when with head movement it feels like there is fluid in his left ear.  Denies any ear pain, discharge, decreased hearing , dizziness.   He hikes a lot planning to go to [place].    Elevated BP in office. Not on any medications in the past. Pt is agreeing to allow me to send medication but states he may not take the medication. He wants to lower it by diet and exercise. Asymptomatic. BP Readings from Last 3 Encounters: 11/08/23 : (!) 150/94 07/15/23 : (!)  154/98 04/28/23 : (!) 144/90          Review of Systems  All other systems reviewed and are negative.     Objective:     BP (!) 150/94   Pulse 62   Ht 5' 7 (1.702 m)   Wt 206 lb (93.4 kg)   SpO2 99%   BMI 32.26 kg/m     Physical Exam Vitals and nursing note reviewed.  Constitutional:      Appearance: Normal appearance.  HENT:     Head: Normocephalic.     Right Ear: External ear normal.     Left Ear: External ear normal.  Eyes:     Conjunctiva/sclera: Conjunctivae normal.  Cardiovascular:     Rate and Rhythm: Normal rate.  Pulmonary:     Effort: Pulmonary effort is normal. No respiratory distress.  Abdominal:     Palpations: Abdomen is soft.  Musculoskeletal:        General: Normal range of motion.  Skin:    General: Skin is warm.  Neurological:     Mental Status: He is alert and oriented to person, place, and time.  Psychiatric:  Mood and Affect: Mood normal.      No results found for any visits on 11/08/23.     The ASCVD Risk score (Arnett DK, et al., 2019) failed to calculate for the following reasons:   Cannot find a previous HDL lab   Cannot find a previous total cholesterol lab      Ladoris MARLA Ny, MD

## 2023-11-08 NOTE — Patient Instructions (Signed)
 BP goal is below 130 /80  - 140/80

## 2023-11-09 DIAGNOSIS — Z125 Encounter for screening for malignant neoplasm of prostate: Secondary | ICD-10-CM | POA: Diagnosis not present

## 2023-11-09 DIAGNOSIS — Z136 Encounter for screening for cardiovascular disorders: Secondary | ICD-10-CM | POA: Diagnosis not present

## 2023-11-09 DIAGNOSIS — Z131 Encounter for screening for diabetes mellitus: Secondary | ICD-10-CM | POA: Diagnosis not present

## 2023-11-09 DIAGNOSIS — Z13220 Encounter for screening for lipoid disorders: Secondary | ICD-10-CM | POA: Diagnosis not present

## 2023-11-09 DIAGNOSIS — Z130 Encounter for screening for diseases of the blood and blood-forming organs and certain disorders involving the immune mechanism: Secondary | ICD-10-CM | POA: Diagnosis not present

## 2023-11-09 DIAGNOSIS — Z79899 Other long term (current) drug therapy: Secondary | ICD-10-CM | POA: Diagnosis not present

## 2023-11-10 ENCOUNTER — Ambulatory Visit: Payer: Self-pay | Admitting: Family Medicine

## 2023-11-10 LAB — PSA: Prostate Specific Ag, Serum: 1.8 ng/mL (ref 0.0–4.0)

## 2023-11-10 LAB — CBC WITH DIFFERENTIAL/PLATELET
Basophils Absolute: 0 x10E3/uL (ref 0.0–0.2)
Basos: 0 %
EOS (ABSOLUTE): 0.3 x10E3/uL (ref 0.0–0.4)
Eos: 4 %
Hematocrit: 50.5 % (ref 37.5–51.0)
Hemoglobin: 16.3 g/dL (ref 13.0–17.7)
Immature Grans (Abs): 0 x10E3/uL (ref 0.0–0.1)
Immature Granulocytes: 0 %
Lymphocytes Absolute: 3.5 x10E3/uL — ABNORMAL HIGH (ref 0.7–3.1)
Lymphs: 39 %
MCH: 28.3 pg (ref 26.6–33.0)
MCHC: 32.3 g/dL (ref 31.5–35.7)
MCV: 88 fL (ref 79–97)
Monocytes Absolute: 0.6 x10E3/uL (ref 0.1–0.9)
Monocytes: 7 %
Neutrophils Absolute: 4.5 x10E3/uL (ref 1.4–7.0)
Neutrophils: 50 %
Platelets: 239 x10E3/uL (ref 150–450)
RBC: 5.76 x10E6/uL (ref 4.14–5.80)
RDW: 12.7 % (ref 11.6–15.4)
WBC: 9 x10E3/uL (ref 3.4–10.8)

## 2023-11-10 LAB — COMPREHENSIVE METABOLIC PANEL WITH GFR
ALT: 28 IU/L (ref 0–44)
AST: 17 IU/L (ref 0–40)
Albumin: 4.7 g/dL (ref 3.9–4.9)
Alkaline Phosphatase: 75 IU/L (ref 44–121)
BUN/Creatinine Ratio: 13 (ref 10–24)
BUN: 14 mg/dL (ref 8–27)
Bilirubin Total: 0.5 mg/dL (ref 0.0–1.2)
CO2: 23 mmol/L (ref 20–29)
Calcium: 9.7 mg/dL (ref 8.6–10.2)
Chloride: 102 mmol/L (ref 96–106)
Creatinine, Ser: 1.08 mg/dL (ref 0.76–1.27)
Globulin, Total: 2.4 g/dL (ref 1.5–4.5)
Glucose: 103 mg/dL — ABNORMAL HIGH (ref 70–99)
Potassium: 5.1 mmol/L (ref 3.5–5.2)
Sodium: 141 mmol/L (ref 134–144)
Total Protein: 7.1 g/dL (ref 6.0–8.5)
eGFR: 76 mL/min/1.73 (ref >59)

## 2023-11-10 LAB — HEMOGLOBIN A1C
Est. average glucose Bld gHb Est-mCnc: 131 mg/dL
Hgb A1c MFr Bld: 6.2 % — ABNORMAL HIGH (ref 4.8–5.6)

## 2023-11-10 LAB — LIPID PANEL
Chol/HDL Ratio: 5.5 ratio — ABNORMAL HIGH (ref 0.0–5.0)
Cholesterol, Total: 197 mg/dL (ref 100–199)
HDL: 36 mg/dL — ABNORMAL LOW (ref >39)
LDL Chol Calc (NIH): 125 mg/dL — ABNORMAL HIGH (ref 0–99)
Triglycerides: 205 mg/dL — ABNORMAL HIGH (ref 0–149)
VLDL Cholesterol Cal: 36 mg/dL (ref 5–40)

## 2023-11-30 ENCOUNTER — Other Ambulatory Visit: Payer: Self-pay | Admitting: Family Medicine

## 2023-11-30 DIAGNOSIS — I10 Essential (primary) hypertension: Secondary | ICD-10-CM

## 2023-12-13 ENCOUNTER — Ambulatory Visit: Admitting: Family Medicine

## 2023-12-14 ENCOUNTER — Ambulatory Visit: Admitting: Family Medicine

## 2024-08-14 ENCOUNTER — Encounter: Admitting: Family Medicine
